# Patient Record
Sex: Male | Born: 1960 | Race: White | Hispanic: No | Marital: Married | Smoking: Former smoker
Health system: Southern US, Community
[De-identification: ages and names within clinical notes are randomized; demographics above are authoritative.]

## PROBLEM LIST (undated history)

## (undated) DIAGNOSIS — E785 Hyperlipidemia, unspecified: Secondary | ICD-10-CM

## (undated) DIAGNOSIS — I2510 Atherosclerotic heart disease of native coronary artery without angina pectoris: Secondary | ICD-10-CM

## (undated) DIAGNOSIS — Z720 Tobacco use: Secondary | ICD-10-CM

## (undated) HISTORY — DX: Hyperlipidemia, unspecified: E78.5

## (undated) HISTORY — DX: Tobacco use: Z72.0

## (undated) HISTORY — DX: Atherosclerotic heart disease of native coronary artery without angina pectoris: I25.10

---

## 2006-04-28 ENCOUNTER — Ambulatory Visit: Payer: Self-pay | Admitting: Gastroenterology

## 2006-05-08 ENCOUNTER — Ambulatory Visit: Payer: Self-pay | Admitting: Gastroenterology

## 2011-04-12 ENCOUNTER — Encounter: Payer: Self-pay | Admitting: Gastroenterology

## 2012-01-06 ENCOUNTER — Encounter: Payer: Self-pay | Admitting: Gastroenterology

## 2015-09-18 ENCOUNTER — Other Ambulatory Visit: Payer: Self-pay | Admitting: Gastroenterology

## 2018-11-04 ENCOUNTER — Other Ambulatory Visit: Payer: Self-pay

## 2018-11-04 DIAGNOSIS — Z20822 Contact with and (suspected) exposure to covid-19: Secondary | ICD-10-CM

## 2018-11-05 LAB — NOVEL CORONAVIRUS, NAA: SARS-CoV-2, NAA: NOT DETECTED

## 2019-03-21 ENCOUNTER — Inpatient Hospital Stay (HOSPITAL_COMMUNITY): Payer: Commercial Managed Care - PPO

## 2019-03-21 ENCOUNTER — Encounter (HOSPITAL_COMMUNITY)
Admission: EM | Disposition: A | Discharge status: Home / Self Care | Payer: Self-pay | Source: Home / Self Care | Attending: Interventional Cardiology

## 2019-03-21 ENCOUNTER — Emergency Department (HOSPITAL_COMMUNITY): Payer: Commercial Managed Care - PPO

## 2019-03-21 ENCOUNTER — Other Ambulatory Visit: Payer: Self-pay

## 2019-03-21 ENCOUNTER — Inpatient Hospital Stay (HOSPITAL_COMMUNITY)
Admission: EM | Admit: 2019-03-21 | Discharge: 2019-03-22 | DRG: 247 | Disposition: A | Discharge status: Home / Self Care | Payer: Commercial Managed Care - PPO | Attending: Interventional Cardiology | Admitting: Interventional Cardiology

## 2019-03-21 DIAGNOSIS — Z955 Presence of coronary angioplasty implant and graft: Secondary | ICD-10-CM

## 2019-03-21 DIAGNOSIS — I340 Nonrheumatic mitral (valve) insufficiency: Secondary | ICD-10-CM | POA: Diagnosis not present

## 2019-03-21 DIAGNOSIS — F17290 Nicotine dependence, other tobacco product, uncomplicated: Secondary | ICD-10-CM | POA: Diagnosis present

## 2019-03-21 DIAGNOSIS — E785 Hyperlipidemia, unspecified: Secondary | ICD-10-CM | POA: Diagnosis present

## 2019-03-21 DIAGNOSIS — Z8249 Family history of ischemic heart disease and other diseases of the circulatory system: Secondary | ICD-10-CM | POA: Diagnosis not present

## 2019-03-21 DIAGNOSIS — I213 ST elevation (STEMI) myocardial infarction of unspecified site: Secondary | ICD-10-CM

## 2019-03-21 DIAGNOSIS — I361 Nonrheumatic tricuspid (valve) insufficiency: Secondary | ICD-10-CM

## 2019-03-21 DIAGNOSIS — I2510 Atherosclerotic heart disease of native coronary artery without angina pectoris: Secondary | ICD-10-CM | POA: Diagnosis present

## 2019-03-21 DIAGNOSIS — I2111 ST elevation (STEMI) myocardial infarction involving right coronary artery: Secondary | ICD-10-CM

## 2019-03-21 DIAGNOSIS — Z20822 Contact with and (suspected) exposure to covid-19: Secondary | ICD-10-CM | POA: Diagnosis present

## 2019-03-21 DIAGNOSIS — I2119 ST elevation (STEMI) myocardial infarction involving other coronary artery of inferior wall: Principal | ICD-10-CM | POA: Diagnosis present

## 2019-03-21 DIAGNOSIS — Z720 Tobacco use: Secondary | ICD-10-CM

## 2019-03-21 HISTORY — PX: CORONARY/GRAFT ACUTE MI REVASCULARIZATION: CATH118305

## 2019-03-21 HISTORY — PX: LEFT HEART CATH AND CORONARY ANGIOGRAPHY: CATH118249

## 2019-03-21 LAB — LIPID PANEL
Cholesterol: 190 mg/dL (ref 0–200)
Cholesterol: 185 mg/dL (ref 0–200)
HDL: 27 mg/dL — ABNORMAL LOW (ref >40)
HDL: 33 mg/dL — ABNORMAL LOW (ref >40)
LDL Cholesterol: 108 mg/dL — ABNORMAL HIGH (ref 0–99)
LDL Cholesterol: 131 mg/dL — ABNORMAL HIGH (ref 0–99)
Total CHOL/HDL Ratio: 7 RATIO
Total CHOL/HDL Ratio: 5.6 RATIO
Triglycerides: 273 mg/dL — ABNORMAL HIGH (ref <150)
Triglycerides: 106 mg/dL (ref <150)
VLDL: 55 mg/dL — ABNORMAL HIGH (ref 0–40)
VLDL: 21 mg/dL (ref 0–40)

## 2019-03-21 LAB — I-STAT CHEM 8, ED
BUN: 18 mg/dL (ref 6–20)
Calcium, Ion: 1.15 mmol/L (ref 1.15–1.40)
Chloride: 106 mmol/L (ref 98–111)
Creatinine, Ser: 0.9 mg/dL (ref 0.61–1.24)
Glucose, Bld: 129 mg/dL — ABNORMAL HIGH (ref 70–99)
HCT: 44 % (ref 39.0–52.0)
Hemoglobin: 15 g/dL (ref 13.0–17.0)
Potassium: 3.4 mmol/L — ABNORMAL LOW (ref 3.5–5.1)
Sodium: 140 mmol/L (ref 135–145)
TCO2: 23 mmol/L (ref 22–32)

## 2019-03-21 LAB — CBC WITH DIFFERENTIAL/PLATELET
Abs Immature Granulocytes: 0.03 10*3/uL (ref 0.00–0.07)
Basophils Absolute: 0.1 10*3/uL (ref 0.0–0.1)
Basophils Relative: 1 %
Eosinophils Absolute: 0.2 10*3/uL (ref 0.0–0.5)
Eosinophils Relative: 3 %
HCT: 45.2 % (ref 39.0–52.0)
Hemoglobin: 15.4 g/dL (ref 13.0–17.0)
Immature Granulocytes: 0 %
Lymphocytes Relative: 44 %
Lymphs Abs: 3.7 10*3/uL (ref 0.7–4.0)
MCH: 32.2 pg (ref 26.0–34.0)
MCHC: 34.1 g/dL (ref 30.0–36.0)
MCV: 94.4 fL (ref 80.0–100.0)
Monocytes Absolute: 0.9 10*3/uL (ref 0.1–1.0)
Monocytes Relative: 10 %
Neutro Abs: 3.5 10*3/uL (ref 1.7–7.7)
Neutrophils Relative %: 42 %
Platelets: 245 10*3/uL (ref 150–400)
RBC: 4.79 MIL/uL (ref 4.22–5.81)
RDW: 12.8 % (ref 11.5–15.5)
WBC: 8.4 10*3/uL (ref 4.0–10.5)
nRBC: 0 % (ref 0.0–0.2)

## 2019-03-21 LAB — COMPREHENSIVE METABOLIC PANEL
ALT: 37 U/L (ref 0–44)
AST: 26 U/L (ref 15–41)
Albumin: 3.4 g/dL — ABNORMAL LOW (ref 3.5–5.0)
Alkaline Phosphatase: 81 U/L (ref 38–126)
Anion gap: 7 (ref 5–15)
BUN: 16 mg/dL (ref 6–20)
CO2: 25 mmol/L (ref 22–32)
Calcium: 8.6 mg/dL — ABNORMAL LOW (ref 8.9–10.3)
Chloride: 107 mmol/L (ref 98–111)
Creatinine, Ser: 0.96 mg/dL (ref 0.61–1.24)
GFR calc Af Amer: >60 mL/min (ref >60)
GFR calc non Af Amer: >60 mL/min (ref >60)
Glucose, Bld: 135 mg/dL — ABNORMAL HIGH (ref 70–99)
Potassium: 3.5 mmol/L (ref 3.5–5.1)
Sodium: 139 mmol/L (ref 135–145)
Total Bilirubin: 0.5 mg/dL (ref 0.3–1.2)
Total Protein: 5.8 g/dL — ABNORMAL LOW (ref 6.5–8.1)

## 2019-03-21 LAB — CBC
HCT: 47.5 % (ref 39.0–52.0)
Hemoglobin: 15.9 g/dL (ref 13.0–17.0)
MCH: 32.1 pg (ref 26.0–34.0)
MCHC: 33.5 g/dL (ref 30.0–36.0)
MCV: 96 fL (ref 80.0–100.0)
Platelets: 233 10*3/uL (ref 150–400)
RBC: 4.95 MIL/uL (ref 4.22–5.81)
RDW: 12.9 % (ref 11.5–15.5)
WBC: 9.6 10*3/uL (ref 4.0–10.5)
nRBC: 0 % (ref 0.0–0.2)

## 2019-03-21 LAB — TROPONIN I (HIGH SENSITIVITY)
Troponin I (High Sensitivity): 296 ng/L (ref <18)
Troponin I (High Sensitivity): 4938 ng/L (ref <18)

## 2019-03-21 LAB — ECHOCARDIOGRAM COMPLETE
Height: 66 in
Weight: 3280 oz

## 2019-03-21 LAB — BASIC METABOLIC PANEL
Anion gap: 8 (ref 5–15)
BUN: 16 mg/dL (ref 6–20)
CO2: 24 mmol/L (ref 22–32)
Calcium: 8.9 mg/dL (ref 8.9–10.3)
Chloride: 108 mmol/L (ref 98–111)
Creatinine, Ser: 0.93 mg/dL (ref 0.61–1.24)
GFR calc Af Amer: >60 mL/min (ref >60)
GFR calc non Af Amer: >60 mL/min (ref >60)
Glucose, Bld: 115 mg/dL — ABNORMAL HIGH (ref 70–99)
Potassium: 3.8 mmol/L (ref 3.5–5.1)
Sodium: 140 mmol/L (ref 135–145)

## 2019-03-21 LAB — PROTIME-INR
INR: 1.1 (ref 0.8–1.2)
Prothrombin Time: 13.6 seconds (ref 11.4–15.2)

## 2019-03-21 LAB — I-STAT CREATININE, ED: Creatinine, Ser: 1 mg/dL (ref 0.61–1.24)

## 2019-03-21 LAB — HEMOGLOBIN A1C
Hgb A1c MFr Bld: 5.5 % (ref 4.8–5.6)
Mean Plasma Glucose: 111.15 mg/dL

## 2019-03-21 LAB — RESPIRATORY PANEL BY RT PCR (FLU A&B, COVID)
Influenza A by PCR: NEGATIVE
Influenza B by PCR: NEGATIVE
SARS Coronavirus 2 by RT PCR: NEGATIVE

## 2019-03-21 LAB — APTT: aPTT: 24 seconds (ref 24–36)

## 2019-03-21 LAB — MRSA PCR SCREENING: MRSA by PCR: NEGATIVE

## 2019-03-21 SURGERY — CORONARY/GRAFT ACUTE MI REVASCULARIZATION
Anesthesia: LOCAL

## 2019-03-21 MED ORDER — LABETALOL HCL 5 MG/ML IV SOLN: 10.0000 mg | INTRAVENOUS | Status: AC | PRN

## 2019-03-21 MED ORDER — ASPIRIN 81 MG PO CHEW
81.0000 mg | CHEWABLE_TABLET | Freq: Every day | ORAL | Status: DC
  Administered 2019-03-21 – 2019-03-22 (×2): 81 mg via ORAL
  Filled 2019-03-21 (×2): qty 1

## 2019-03-21 MED ORDER — HEPARIN SODIUM (PORCINE) 1000 UNIT/ML IJ SOLN
INTRAMUSCULAR | Status: AC
  Filled 2019-03-21: qty 1

## 2019-03-21 MED ORDER — HEPARIN SODIUM (PORCINE) 1000 UNIT/ML IJ SOLN
INTRAMUSCULAR | Status: DC | PRN
  Administered 2019-03-21: 3000 [IU] via INTRAVENOUS
  Administered 2019-03-21: 4000 [IU] via INTRAVENOUS
  Administered 2019-03-21: 8000 [IU] via INTRAVENOUS

## 2019-03-21 MED ORDER — VERAPAMIL HCL 2.5 MG/ML IV SOLN
INTRAVENOUS | Status: AC
  Filled 2019-03-21: qty 2

## 2019-03-21 MED ORDER — NITROGLYCERIN 1 MG/10 ML FOR IR/CATH LAB
INTRA_ARTERIAL | Status: DC | PRN
  Administered 2019-03-21: 200 ug via INTRA_ARTERIAL

## 2019-03-21 MED ORDER — TIROFIBAN HCL IN NACL 5-0.9 MG/100ML-% IV SOLN
INTRAVENOUS | Status: AC | PRN
  Administered 2019-03-21: 0.15 ug/kg/min via INTRAVENOUS

## 2019-03-21 MED ORDER — SODIUM CHLORIDE 0.9% FLUSH
3.0000 mL | Freq: Two times a day (BID) | INTRAVENOUS | Status: DC
  Administered 2019-03-21: 22:00:00 3 mL via INTRAVENOUS

## 2019-03-21 MED ORDER — NITROGLYCERIN 1 MG/10 ML FOR IR/CATH LAB
INTRA_ARTERIAL | Status: AC
  Filled 2019-03-21: qty 10

## 2019-03-21 MED ORDER — ACETAMINOPHEN 325 MG PO TABS: 650.0000 mg | ORAL_TABLET | ORAL | Status: DC | PRN

## 2019-03-21 MED ORDER — IOHEXOL 350 MG/ML SOLN
INTRAVENOUS | Status: DC | PRN
  Administered 2019-03-21: 130 mL

## 2019-03-21 MED ORDER — METOPROLOL TARTRATE 12.5 MG HALF TABLET
12.5000 mg | ORAL_TABLET | Freq: Two times a day (BID) | ORAL | Status: DC
  Administered 2019-03-21 – 2019-03-22 (×3): 12.5 mg via ORAL
  Filled 2019-03-21 (×3): qty 1

## 2019-03-21 MED ORDER — TICAGRELOR 90 MG PO TABS
ORAL_TABLET | ORAL | Status: AC
  Filled 2019-03-21: qty 2

## 2019-03-21 MED ORDER — HEPARIN (PORCINE) IN NACL 1000-0.9 UT/500ML-% IV SOLN
INTRAVENOUS | Status: AC
  Filled 2019-03-21: qty 1000

## 2019-03-21 MED ORDER — ATORVASTATIN CALCIUM 80 MG PO TABS
80.0000 mg | ORAL_TABLET | Freq: Every day | ORAL | Status: DC
  Administered 2019-03-21: 80 mg via ORAL
  Filled 2019-03-21: qty 1

## 2019-03-21 MED ORDER — SODIUM CHLORIDE 0.9 % IV SOLN: INTRAVENOUS | Status: AC

## 2019-03-21 MED ORDER — SODIUM CHLORIDE 0.9 % IV SOLN: 250.0000 mL | INTRAVENOUS | Status: DC | PRN

## 2019-03-21 MED ORDER — HYDRALAZINE HCL 20 MG/ML IJ SOLN: 10.0000 mg | INTRAMUSCULAR | Status: AC | PRN

## 2019-03-21 MED ORDER — LIDOCAINE HCL (PF) 1 % IJ SOLN
INTRAMUSCULAR | Status: DC | PRN
  Administered 2019-03-21: 2 mL

## 2019-03-21 MED ORDER — CHLORHEXIDINE GLUCONATE CLOTH 2 % EX PADS
6.0000 | MEDICATED_PAD | Freq: Every day | CUTANEOUS | Status: DC
  Administered 2019-03-21: 6 via TOPICAL

## 2019-03-21 MED ORDER — TICAGRELOR 90 MG PO TABS
ORAL_TABLET | ORAL | Status: DC | PRN
  Administered 2019-03-21: 180 mg via ORAL

## 2019-03-21 MED ORDER — SODIUM CHLORIDE 0.9 % IV SOLN
INTRAVENOUS | Status: DC
  Administered 2019-03-21: 02:00:00 10 mL/h via INTRAVENOUS

## 2019-03-21 MED ORDER — HEPARIN SODIUM (PORCINE) 1000 UNIT/ML IJ SOLN
INTRAMUSCULAR | Status: AC
  Filled 2019-03-21: qty 2

## 2019-03-21 MED ORDER — HEPARIN SODIUM (PORCINE) 5000 UNIT/ML IJ SOLN
4000.0000 [IU] | Freq: Once | INTRAMUSCULAR | Status: AC
  Administered 2019-03-21: 02:00:00 4000 [IU] via INTRAVENOUS

## 2019-03-21 MED ORDER — TIROFIBAN (AGGRASTAT) BOLUS VIA INFUSION
INTRAVENOUS | Status: DC | PRN
  Administered 2019-03-21: 03:00:00 2325 ug via INTRAVENOUS

## 2019-03-21 MED ORDER — TICAGRELOR 90 MG PO TABS
90.0000 mg | ORAL_TABLET | Freq: Two times a day (BID) | ORAL | Status: DC
  Administered 2019-03-21 – 2019-03-22 (×3): 90 mg via ORAL
  Filled 2019-03-21 (×3): qty 1

## 2019-03-21 MED ORDER — VERAPAMIL HCL 2.5 MG/ML IV SOLN
INTRAVENOUS | Status: DC | PRN
  Administered 2019-03-21: 02:00:00 10 mL via INTRA_ARTERIAL

## 2019-03-21 MED ORDER — SODIUM CHLORIDE 0.9% FLUSH: 3.0000 mL | INTRAVENOUS | Status: DC | PRN

## 2019-03-21 MED ORDER — HEPARIN (PORCINE) IN NACL 1000-0.9 UT/500ML-% IV SOLN
INTRAVENOUS | Status: DC | PRN
  Administered 2019-03-21 (×2): 500 mL

## 2019-03-21 MED ORDER — LIDOCAINE HCL (PF) 1 % IJ SOLN
INTRAMUSCULAR | Status: AC
  Filled 2019-03-21: qty 30

## 2019-03-21 MED ORDER — ONDANSETRON HCL 4 MG/2ML IJ SOLN: 4.0000 mg | Freq: Four times a day (QID) | INTRAMUSCULAR | Status: DC | PRN

## 2019-03-21 SURGICAL SUPPLY — 20 items
BALLN SAPPHIRE 2.5X12 (BALLOONS) ×2
BALLOON SAPPHIRE 2.5X12 (BALLOONS) IMPLANT
CATH EXTRAC PRONTO 5.5F 138CM (CATHETERS) ×1 IMPLANT
CATH INFINITI 5 FR JL3.5 (CATHETERS) ×1 IMPLANT
CATH INFINITI JR4 5F (CATHETERS) ×1 IMPLANT
CATH LAUNCHER 6FR AL1 (CATHETERS) IMPLANT
CATH TELESCOPE 6F GEC (CATHETERS) ×1 IMPLANT
CATHETER LAUNCHER 6FR AL1 (CATHETERS) ×2
GLIDESHEATH SLEND SS 6F .021 (SHEATH) ×1 IMPLANT
GUIDEWIRE INQWIRE 1.5J.035X260 (WIRE) IMPLANT
INQWIRE 1.5J .035X260CM (WIRE) ×2
KIT ENCORE 26 ADVANTAGE (KITS) ×1 IMPLANT
KIT HEART LEFT (KITS) ×2 IMPLANT
KIT HEMO VALVE WATCHDOG (MISCELLANEOUS) ×1 IMPLANT
PACK CARDIAC CATHETERIZATION (CUSTOM PROCEDURE TRAY) ×2 IMPLANT
STENT RESOLUTE ONYX 4.0X26 (Permanent Stent) ×1 IMPLANT
TRANSDUCER W/STOPCOCK (MISCELLANEOUS) ×2 IMPLANT
TUBING CIL FLEX 10 FLL-RA (TUBING) ×2 IMPLANT
WIRE ASAHI PROWATER 180CM (WIRE) ×2 IMPLANT
WIRE HI TORQ BMW 190CM (WIRE) ×1 IMPLANT

## 2019-03-21 NOTE — ED Notes (Signed)
Patient to Cath lab 

## 2019-03-21 NOTE — Progress Notes (Signed)
  Echocardiogram 2D Echocardiogram has been performed.  Celene Skeen 03/21/2019, 3:52 PM

## 2019-03-21 NOTE — H&P (Addendum)
CARDIOLOGY ADMISSION NOTE  [name] MRN: [id number] DOB/AGE: 1960/04/25 59 y.o.  Admit date: 03/21/2019 Primary Physician No primary care provider on file. Primary Cardiologist   No primary care provider on file. Chief Complaint : STEMI  HPI:  Mr. Gregory Villa is a 59 year old male with tobacco use, but no other significant past medical history who presents with acute onset chest pain and ECG concerning for ST elevation in the inferoposterior leads.  Patient was in his usual state of health until early the morning of admission around 1230 am when he developed acute onset, severe substernal chest pain with radiation down the left arm associated with nausea. EMS was called with initial strip showing ST elevations in the inferior leads with ST depressions in V2, V3. He received 324mg  of aspirin and SLNTG which dropped his pressures, but relieved his chest discomfort. He subsequently received a bolus of IV crystalloids with improvement in his pressures.   Patient was brought to the ED where initial vital signs showed BP 122/92, HR 53. Initial ECG in the [facility] ED showed persistent STE and he remained chest pain free. He received 4000 units of heparin and was brought to the cath lab once the team had assembled.   Patient denies a previous history of coronary disease or a chest pain/discomfort. He smokes cigars daily, but denies any recreational drug use.    No past medical history on file.  **The histories are not reviewed yet. Please review them in the "History" navigator section and refresh this SmartLink.  Not on File No current facility-administered medications on file prior to encounter.   No current outpatient medications on file prior to encounter.   Social History   Socioeconomic History  . Marital status: Divorced    Spouse name: Not on file  . Number of children: Not on file  . Years of education: Not on file  . Highest education level: Not on file  Occupational  History  . Not on file  Tobacco Use  . Smoking status: Not on file  Substance and Sexual Activity  . Alcohol use: Not on file  . Drug use: Not on file  . Sexual activity: Not on file  Other Topics Concern  . Not on file  Social History Narrative  . Not on file   Social Determinants of Health   Financial Resource Strain:   . Difficulty of Paying Living Expenses: Not on file  Food Insecurity:   . Worried About [facility] in the Last Year: Not on file  . Ran Out of Food in the Last Year: Not on file  Transportation Needs:   . Lack of Transportation (Medical): Not on file  . Lack of Transportation (Non-Medical): Not on file  Physical Activity:   . Days of Exercise per Week: Not on file  . Minutes of Exercise per Session: Not on file  Stress:   . Feeling of Stress : Not on file  Social Connections:   . Frequency of Communication with Friends and Family: Not on file  . Frequency of Social Gatherings with Friends and Family: Not on file  . Attends Religious Services: Not on file  . Active Member of Clubs or Organizations: Not on file  . Attends Programme researcher, broadcasting/film/video Meetings: Not on file  . Marital Status: Not on file  Intimate Partner Violence:   . Fear of Current or Ex-Partner: Not on file  . Emotionally Abused: Not on file  . Physically Abused:  Not on file  . Sexually Abused: Not on file    No family history on file. Two brothers who have had open heart surgery.    Review of Systems: [y] = yes, [ ]  = no       General: Weight gain [] ; Weight loss [ ] ; Anorexia [ ] ; Fatigue [ ] ; Fever [ ] ; Chills [ ] ; Weakness [ ]     Cardiac: As above in HPI.   Pulmonary: Cough [ ] ; Wheezing[ ] ; Hemoptysis[ ] ; Sputum [ ] ; Snoring [ ]     GI: Vomiting[ ] ; Dysphagia[ ] ; Melena[ ] ; Hematochezia [ ] ; Heartburn[ ] ; Abdominal pain [ ] ; Constipation [ ] ; Diarrhea [ ] ; BRBPR [ ]     GU: Hematuria[ ] ; Dysuria [ ] ; Nocturia[ ]   Vascular: Pain in legs with walking [ ] ; Pain in feet with  lying flat [ ] ; Non-healing sores [ ] ; Stroke [ ] ; TIA [ ] ; Slurred speech [ ] ;    Neuro: Headaches[ ] ; Vertigo[ ] ; Seizures[ ] ; Paresthesias[ ] ;Blurred vision [ ] ; Diplopia [ ] ; Vision changes [ ]     Ortho/Skin: Arthritis [ ] ; Joint pain [ ] ; Muscle pain [ ] ; Joint swelling [ ] ; Back Pain [ ] ; Rash [ ]     Psych: Depression[ ] ; Anxiety[ ]     Heme: Bleeding problems [ ] ; Clotting disorders [ ] ; Anemia [ ]     Endocrine: Diabetes [ ] ; Thyroid dysfunction[ ]   Physical Exam: Blood pressure (!) 122/92, pulse (!) 53, temperature 97.7 F (36.5 C), temperature source Oral, resp. rate (!) 23, height 5\' 6"  (1.676 m), weight 93 kg, SpO2 100 %.   GENERAL: Patient is afebrile, Vital signs reviewed, Well appearing, Patient appears comfortable, Alert and lucid. EYES: Normal inspection. HEENT:  normocephalic, atraumatic , normal ENT inspection. NECK:  supple , normal inspection. CARD:  Regular rhythm, bradycardic, heart sounds normal. RESP:  no respiratory distress, breath sounds normal. ABD: soft, nontender, nondistended, BS present MUSC:  normal ROM, non-tender , no pedal edema . SKIN: color normal, no rash, warm, dry . NEURO: awake & alert, lucid, no motor/sensory deficit.  PSYCH: mood/affect normal.   Labs: No results found for: BUN No results found for: CREATININE No results found for: NA, K, CL, CO2 No results found for: TROPONINI No results found for: WBC, HGB, HCT, MCV, PLT No results found for: CHOL, HDL, LDLCALC, LDLDIRECT, TRIG, CHOLHDL No results found for: ALT, AST, GGT, ALKPHOS, BILITOT    Radiology:  None  EKG:  As above.   Cath:  Mid RCA lesion is 100% stenosed.  A drug-eluting stent was successfully placed using a STENT RESOLUTE ONYX 4.0X26.  Post intervention, there is a 0% residual stenosis.  RPAV lesion is 99% stenosed.  Balloon angioplasty was performed using a BALLOON SAPPHIRE 2.5X12.  Post intervention, there is a 30% residual stenosis.  1st Mrg lesion  is 90% stenosed.  Mid LAD lesion is 100% stenosed. This is a CTO with retrograde filling from the diagonal, antegrade into the mid to distal LAD.  Ost LAD to Prox LAD lesion is 50% stenosed.  Mid Cx lesion is 25% stenosed.  2nd Mrg lesion is 25% stenosed.  Dist Cx lesion is 80% stenosed. This is a small vessel.  The left ventricular systolic function is normal.  LV end diastolic pressure is mildly elevated.  The left ventricular ejection fraction is 55-65% by visual estimate.  There is no aortic valve stenosis.  ASSESSMENT AND PLAN:   Mr. Testerman is a 59 year old  male with tobacco use, but no other significant past medical history who presents with acute onset chest pain and ECG concerning for ST elevation in the inferoposterior leads. Patient is now s/p PCI of RCA lesions.    # STEMI # CAD s/p PCI (DES - mid RCA, POBA RPAV) with residual disease # Secondary Prevention/Risk Factor Modification - DAPT with Asa 81mg  and Ticagrelor 90mg  daily  - Atorvastatin 80mg  daily - Check lipid panel and A1c - Strongly encourage tobacco cessation - Consider bringing back in 4 weeks for staged intervention of CTO in mid LAD  - TTE in AM    Signed: 03/21/2019, 2:02 AM   I have examined the patient and reviewed assessment and plan and discussed with patient.  Agree with above as stated.  I personally reviewed the ECG and made the decision for the patient to come to the cath lab for emergency procedure.  Severe diffuse disease.  Culprit was RCA.  DES to mid RCA and POBA to posterolateral branch.  IV tirofiban to help reduce large thrombus burden.    Discussed with patient and wife that we would plan for CTO PCI of LAD in 4 weeks or so.  They are in agreement.    Post procedural right radial hematoma now resolved with compression and adjustment of TR band placement.    Spoke about RF modification including stopping smoking.   

## 2019-03-21 NOTE — ED Provider Notes (Signed)
[facility] EMERGENCY DEPARTMENT Provider Note   CSN: [id number] Arrival date & time: 03/21/19  0149     History Chief Complaint  Patient presents with  . Code STEMI    Gregory Villa is a 59 y.o. male.  The history is provided by the patient and medical records.    59 y.o. M arriving to ED as a CODE STEMI.  States he woke up with mild pain around 0030 this morning with some left sided chest pain.  States he felt nauseated, SOB, and had pain in left arm.  EMS was called and was given 325 ASA and 1 NTG.  BP dropped into the 80's after NTG, improved to low 100's with small IVFB.  On arrival, minimal pain, just some mild substernal discomfort.  No further nausea, SOB, or left arm pain.  No prior cardiac history, no recent exertional symptoms.  No recent cough, fever, or other URI symptoms.    No past medical history on file.  There are no problems to display for this patient.       No family history on file.  Social History   Tobacco Use  . Smoking status: Not on file  Substance Use Topics  . Alcohol use: Not on file  . Drug use: Not on file    Home Medications Prior to Admission medications   Not on File    Allergies    Patient has no allergy information on record.  Review of Systems   Review of Systems  Cardiovascular: Positive for chest pain.  All other systems reviewed and are negative.   Physical Exam Updated Vital Signs BP (!) 122/92 (BP Location: Right Arm)   Pulse (!) 53   Temp 97.7 F (36.5 C) (Oral)   Resp (!) 23   Ht 5\' 6"  (1.676 m)   Wt 93 kg   SpO2 100%   BMI 33.09 kg/m   Physical Exam Vitals and nursing note reviewed.  Constitutional:      Appearance: He is well-developed.  HENT:     Head: Normocephalic and atraumatic.  Eyes:     Conjunctiva/sclera: Conjunctivae normal.     Pupils: Pupils are equal, round, and reactive to light.  Cardiovascular:     Rate and Rhythm: Normal rate and regular rhythm.     Heart  sounds: Normal heart sounds.  Pulmonary:     Effort: Pulmonary effort is normal. No respiratory distress.     Breath sounds: Normal breath sounds. No stridor.  Abdominal:     General: Bowel sounds are normal.     Palpations: Abdomen is soft.  Musculoskeletal:        General: Normal range of motion.     Cervical back: Normal range of motion.  Skin:    General: Skin is warm and dry.  Neurological:     Mental Status: He is alert and oriented to person, place, and time.     ED Results / Procedures / Treatments   Labs (all labs ordered are listed, but only abnormal results are displayed) Labs Reviewed  I-STAT CHEM 8, ED - Abnormal; Notable for the following components:      Result Value   Potassium 3.4 (*)    Glucose, Bld 129 (*)    All other components within normal limits  RESPIRATORY PANEL BY RT PCR (FLU A&B, COVID)  CBC WITH DIFFERENTIAL/PLATELET  PROTIME-INR  APTT  COMPREHENSIVE METABOLIC PANEL  LIPID PANEL  I-STAT CREATININE, ED  TROPONIN I (HIGH SENSITIVITY)  EKG EKG Interpretation  Date/Time:  Sunday March 21 2019 01:55:16 EST Ventricular Rate:  53 PR Interval:    QRS Duration: 95 QT Interval:  426 QTC Calculation: 400 R Axis:   83 Text Interpretation: Sinus rhythm Inferoposterior infarct, acute (RCA) Probable RV involvement, suggest recording right precordial leads >>> Acute MI <<< Confirmed by Gilda Crease [phone]) on 03/21/2019 2:00:19 AM   Radiology No results found.  Procedures Procedures (including critical care time)  CRITICAL CARE Performed by: Garlon Hatchet   Total critical care time: 45 minutes  Critical care time was exclusive of separately billable procedures and treating other patients.  Critical care was necessary to treat or prevent imminent or life-threatening deterioration.  Critical care was time spent personally by me on the following activities: development of treatment plan with patient and/or surrogate as well as  nursing, discussions with consultants, evaluation of patient's response to treatment, examination of patient, obtaining history from patient or surrogate, ordering and performing treatments and interventions, ordering and review of laboratory studies, ordering and review of radiographic studies, pulse oximetry and re-evaluation of patient's condition.   Medications Ordered in ED Medications  0.9 %  sodium chloride infusion (10 mL/hr Intravenous New Bag/Given 03/21/19 0203)  heparin injection 4,000 Units (4,000 Units Intravenous Given 03/21/19 0203)    ED Course  I have reviewed the triage vital signs and the nursing notes.  Pertinent labs & imaging results that were available during my care of the patient were reviewed by me and considered in my medical decision making (see chart for details).    MDM Rules/Calculators/A&P  59 y.o. M presenting to the ED as  CODE STEMI.  Chest pain began around 0030 this morning with associated left arm pain, nausea, and SOB.  Pain improved with ASA and NTG, slight drop in BP which improved with IVFB.  No prior cardiac history, no exertional symptoms recently.  EKG here with inferior STEMI.  Patient evaluated by cardiology at bedside, will take emergently to cath lab.  IV heparin started.  Rapid COVID sent.  Final Clinical Impression(s) / ED Diagnoses Final diagnoses:  ST elevation myocardial infarction (STEMI), unspecified artery [facility])    Rx / DC Orders ED Discharge Orders    None       Garlon Hatchet, PA-C 03/21/19 0227    Gilda Crease, MD 03/21/19 [phone]

## 2019-03-21 NOTE — Plan of Care (Signed)
  Problem: Education: Goal: Knowledge of General Education information will improve Description: Including pain rating scale, medication(s)/side effects and non-pharmacologic comfort measures Outcome: Progressing   Problem: Health Behavior/Discharge Planning: Goal: Ability to manage health-related needs will improve Outcome: Progressing   Problem: Clinical Measurements: Goal: Ability to maintain clinical measurements within normal limits will improve Outcome: Progressing Goal: Will remain free from infection Outcome: Progressing Goal: Diagnostic test results will improve Outcome: Progressing Goal: Respiratory complications will improve Outcome: Progressing Goal: Cardiovascular complication will be avoided Outcome: Progressing   Problem: Activity: Goal: Risk for activity intolerance will decrease Outcome: Progressing   Problem: Nutrition: Goal: Adequate nutrition will be maintained Outcome: Progressing   Problem: Coping: Goal: Level of anxiety will decrease Outcome: Progressing   Problem: Elimination: Goal: Will not experience complications related to bowel motility Outcome: Progressing Goal: Will not experience complications related to urinary retention Outcome: Progressing   Problem: Pain Managment: Goal: General experience of comfort will improve Outcome: Progressing   Problem: Safety: Goal: Ability to remain free from injury will improve Outcome: Progressing   Problem: Skin Integrity: Goal: Risk for impaired skin integrity will decrease Outcome: Progressing   Problem: Education: Goal: Understanding of CV disease, CV risk reduction, and recovery process will improve Outcome: Progressing Goal: Individualized Educational Video(s) Outcome: Progressing   Problem: Activity: Goal: Ability to return to baseline activity level will improve Outcome: Progressing   Problem: Cardiovascular: Goal: Ability to achieve and maintain adequate cardiovascular perfusion  will improve Outcome: Progressing Goal: Vascular access site(s) Level 0-1 will be maintained Outcome: Progressing   Problem: Health Behavior/Discharge Planning: Goal: Ability to safely manage health-related needs after discharge will improve Outcome: Progressing   Problem: Education: Goal: Understanding of cardiac disease, CV risk reduction, and recovery process will improve Outcome: Progressing Goal: Understanding of medication regimen will improve Outcome: Progressing Goal: Individualized Educational Video(s) Outcome: Progressing   Problem: Activity: Goal: Ability to tolerate increased activity will improve Outcome: Progressing   Problem: Cardiac: Goal: Ability to achieve and maintain adequate cardiopulmonary perfusion will improve Outcome: Progressing Goal: Vascular access site(s) Level 0-1 will be maintained Outcome: Progressing   Problem: Health Behavior/Discharge Planning: Goal: Ability to safely manage health-related needs after discharge will improve Outcome: Progressing  Pt ambulated in halls with RN twice this shift. Pt tolerated well, no complications. Pt had 17 beat run VT, asymptomatic while resting in bed. No ectopy noted during ambulation or otherwise. Spouse at bedside most of day. VSS.

## 2019-03-21 NOTE — ED Triage Notes (Addendum)
BIB thru [facility], reportedly had gradual increasing chest pain around 12:30-12:35 am. Initial EKG onsite reportedly revealed acute ST elevation inferior infarct with posterior extension, patient was given aspiring and nitroglycerin en route to the hospital. Currently patient verbalized nitroglycerin helped and asymptomatic right now. VSS, cardiologist in right now discussing cardiac catherization.

## 2019-03-21 NOTE — ED Notes (Signed)
[phone] Gregory Villa pts wife update when available

## 2019-03-22 ENCOUNTER — Telehealth: Payer: Self-pay | Admitting: Physician Assistant

## 2019-03-22 DIAGNOSIS — E785 Hyperlipidemia, unspecified: Secondary | ICD-10-CM

## 2019-03-22 DIAGNOSIS — I213 ST elevation (STEMI) myocardial infarction of unspecified site: Secondary | ICD-10-CM

## 2019-03-22 DIAGNOSIS — Z720 Tobacco use: Secondary | ICD-10-CM

## 2019-03-22 LAB — CBC
HCT: 42.4 % (ref 39.0–52.0)
Hemoglobin: 14.7 g/dL (ref 13.0–17.0)
MCH: 33 pg (ref 26.0–34.0)
MCHC: 34.7 g/dL (ref 30.0–36.0)
MCV: 95.1 fL (ref 80.0–100.0)
Platelets: 230 10*3/uL (ref 150–400)
RBC: 4.46 MIL/uL (ref 4.22–5.81)
RDW: 13.1 % (ref 11.5–15.5)
WBC: 7.9 10*3/uL (ref 4.0–10.5)
nRBC: 0 % (ref 0.0–0.2)

## 2019-03-22 LAB — BASIC METABOLIC PANEL
Anion gap: 9 (ref 5–15)
BUN: 16 mg/dL (ref 6–20)
CO2: 21 mmol/L — ABNORMAL LOW (ref 22–32)
Calcium: 8.7 mg/dL — ABNORMAL LOW (ref 8.9–10.3)
Chloride: 106 mmol/L (ref 98–111)
Creatinine, Ser: 1.02 mg/dL (ref 0.61–1.24)
GFR calc Af Amer: >60 mL/min (ref >60)
GFR calc non Af Amer: >60 mL/min (ref >60)
Glucose, Bld: 101 mg/dL — ABNORMAL HIGH (ref 70–99)
Potassium: 4 mmol/L (ref 3.5–5.1)
Sodium: 136 mmol/L (ref 135–145)

## 2019-03-22 LAB — POCT ACTIVATED CLOTTING TIME
Activated Clotting Time: 246 seconds
Activated Clotting Time: 263 seconds

## 2019-03-22 LAB — TROPONIN I (HIGH SENSITIVITY): Troponin I (High Sensitivity): >27000 ng/L (ref <18)

## 2019-03-22 LAB — MAGNESIUM: Magnesium: 2.1 mg/dL (ref 1.7–2.4)

## 2019-03-22 MED ORDER — TICAGRELOR 90 MG PO TABS: 90.0000 mg | ORAL_TABLET | Freq: Two times a day (BID) | ORAL | 2 refills | Status: DC

## 2019-03-22 MED ORDER — NITROGLYCERIN 0.4 MG SL SUBL: 0.4000 mg | SUBLINGUAL_TABLET | SUBLINGUAL | 2 refills | Status: DC | PRN

## 2019-03-22 MED ORDER — ASPIRIN 81 MG PO CHEW: 81.0000 mg | CHEWABLE_TABLET | Freq: Every day | ORAL | 1 refills | Status: DC

## 2019-03-22 MED ORDER — METOPROLOL SUCCINATE ER 25 MG PO TB24: 12.5000 mg | ORAL_TABLET | Freq: Every day | ORAL | 0 refills | Status: DC

## 2019-03-22 MED ORDER — ATORVASTATIN CALCIUM 80 MG PO TABS: 80.0000 mg | ORAL_TABLET | Freq: Every day | ORAL | 1 refills | Status: DC

## 2019-03-22 MED ORDER — METOPROLOL SUCCINATE ER 25 MG PO TB24: 12.5000 mg | ORAL_TABLET | Freq: Every day | ORAL | Status: DC

## 2019-03-22 MED FILL — NITROGLYCERIN 0.4 MG TAB SL: 0.4 | 8 days supply | Qty: 25 | Fill #0

## 2019-03-22 MED FILL — METOPROLOL SUCCINATE ER 25: 25 | 30 days supply | Qty: 15 | Fill #0

## 2019-03-22 MED FILL — ATORVASTATIN CALCIUM 80 MG: 80 | 30 days supply | Qty: 30 | Fill #0

## 2019-03-22 MED FILL — ASPIRIN LOW DOSE 81 MG CHEW: 81 | 90 days supply | Qty: 90 | Fill #0

## 2019-03-22 MED FILL — BRILINTA 90 MG TABLET: 90 | 30 days supply | Qty: 60 | Fill #0

## 2019-03-22 NOTE — Telephone Encounter (Signed)
**Note De-identified Gregory Villa Obfuscation** The pt is being discharged from the hospital today. We will call him tomorrow 

## 2019-03-22 NOTE — Discharge Summary (Signed)
Discharge Summary    [name],  MRN: [id number], DOB/AGE: [birth year] 59 y.o.  Admit date: 03/21/2019 Discharge date: 03/22/2019  Primary Care Provider: No primary care provider on file. Primary Cardiologist: Gregory MussJayadeep Varanasi, MD  Discharge Diagnoses    Principal Problem:   STEMI (ST elevation myocardial infarction) [facility](HCC) Active Problems:   Hyperlipidemia   Tobacco use   Allergies No Known Allergies  Diagnostic Studies/Procedures    TTE: 03/21/19  IMPRESSIONS    1. Left ventricular ejection fraction, by visual estimation, is 55 to 60%. The left ventricle has normal function. There is moderately increased left ventricular hypertrophy.  2. The left ventricle has no regional wall motion abnormalities.  3. Global right ventricle has mildly reduced systolic function.The right ventricular size is mildly enlarged. No increase in right ventricular wall thickness.  4. Left atrial size was mildly dilated.  5. Right atrial size was normal.  6. The mitral valve is normal in structure. Mild mitral valve regurgitation. No evidence of mitral stenosis.  7. The tricuspid valve is normal in structure.  8. The aortic valve is normal in structure. Aortic valve regurgitation is not visualized. No evidence of aortic valve sclerosis or stenosis.  9. The pulmonic valve was normal in structure. Pulmonic valve regurgitation is not visualized. 10. The inferior vena cava is normal in size with greater than 50% respiratory variability, suggesting right atrial pressure of 3 mmHg. 11. RVEF is mildly reduced. 12. There is hypokinesis in the basal and mid inferior and inferolateral walls, overall LVEF 55-60%. There is significant LVH more pronounced in theapical segment, a cardiac MRI is recommended to evaluate for apical form of hypertrophic cardiomyopathy.  Cath: 03/21/19   Mid RCA lesion is 100% stenosed.  A drug-eluting stent was successfully placed using a STENT RESOLUTE ONYX  4.0X26.  Post intervention, there is a 0% residual stenosis.  RPAV lesion is 99% stenosed.  Balloon angioplasty was performed using a BALLOON SAPPHIRE 2.5X12.  Post intervention, there is a 30% residual stenosis.  1st Mrg lesion is 90% stenosed.  Mid LAD lesion is 100% stenosed. This is a CTO with retrograde filling from the diagonal, antegrade into the mid to distal LAD.  Ost LAD to Prox LAD lesion is 50% stenosed.  Mid Cx lesion is 25% stenosed.  2nd Mrg lesion is 25% stenosed.  Dist Cx lesion is 80% stenosed. This is a small vessel.  The left ventricular systolic function is normal.  LV end diastolic pressure is mildly elevated.  The left ventricular ejection fraction is 55-65% by visual estimate.  There is no aortic valve stenosis.   Continue DAPT and initiate aggressive secondary prevention.  Watch in ICU.  Consider CTO PCI of the LAD in 4 weeks once he has recovered from this MI.   Continue IV tirofiban for a few hours given heavy thrombus burden and residual thrombus in small, posterolateral artery branches.   Diagnostic Dominance: Right  Intervention    _____________   History of Present Illness     Gregory Villa is a 59 year old male with tobacco use, but no other significant past medical history who presented with acute onset chest pain and ECG concerning for ST elevation in the inferoposterior leads.  Patient was in his usual state of health until early the morning of admission around 1230 am when he developed acute onset, severe substernal chest pain with radiation down the left arm associated with nausea. EMS was called with initial strip showing ST  elevations in the inferior leads with ST depressions in V2, V3. He received 324mg  of aspirin and SLNTG which dropped his pressures, but relieved his chest discomfort. He subsequently received a bolus of IVFs.   Patient was brought to the ED where initial vital signs showed BP 122/92, HR 53. Initial ECG in the  [facility] ED showed persistent STE and he remained chest pain free. He received 4000 units of heparin and was brought to the cath lab.  Patient denied a previous history of coronary disease or a chest pain/discomfort. He smokes cigars daily, but denies any recreational drug use.    Hospital Course   Underwent cardiac cath noted above with 100% stenosis in the mRCA with PCI/DES x1. Also noted to have disease in the RPAV with balloon angioplasty, and CTO of the mLAD which fills via collaterals. 80% lesion in the Lcx which was noted to be a small nondominant vessel. He was started on DAPT with ASA/Brilinta post cath and continued on tirofiban for several hours post cath given heavy thrombus burden. hsTn peaked > 27000. He was started on metoprolol 12.5 mg daily with blood pressures tolerating. LDL noted at 131 and placed on high dose statin. Follow up echo showed normal EF with hypokinesis in the basal/mid inferolateral walls. Also noted to have significant LVH more pronounced in the apical segment. Recommendations for cardiac MRI to evaluate for apical form of hypertrophic cardiomyopathy. He worked well with cardiac rehab without recurrent chest pain. Per [facility], will plan for elective PCI of the LAD in about one month. He was counseled on the need for smoking cessation throughout admission.   Gregory Villa was seen by Dr. Noni Saupe and determined stable for discharge home. Follow up in the office has been arranged. Medications are listed below.   _____________  Discharge Vitals Blood pressure 106/75, pulse 64, temperature 98.5 F (36.9 C), temperature source Oral, resp. rate 19, height 5\' 6"  (1.676 m), weight 93 kg, SpO2 97 %.  Filed Weights   03/21/19 0158  Weight: 93 kg    Labs & Radiologic Studies    CBC Recent Labs    03/21/19 0156 03/21/19 0406 03/22/19 0203  WBC 8.4 9.6 7.9  NEUTROABS 3.5  --   --   HGB 15.4 15.9 14.7  HCT 45.2 47.5 42.4  MCV 94.4 96.0 95.1  PLT 245 233 230    Basic Metabolic Panel Recent Labs    05/19/19 0406 03/22/19 0203  NA 140 136  K 3.8 4.0  CL 108 106  CO2 24 21*  GLUCOSE 115* 101*  BUN 16 16  CREATININE 0.93 1.02  CALCIUM 8.9 8.7*  MG  --  2.1   Liver Function Tests Recent Labs    03/21/19 0156  AST 26  ALT 37  ALKPHOS 81  BILITOT 0.5  PROT 5.8*  ALBUMIN 3.4*   No results for input(s): LIPASE, AMYLASE in the last 72 hours. Cardiac Enzymes No results for input(s): CKTOTAL, CKMB, CKMBINDEX, TROPONINI in the last 72 hours. BNP Invalid input(s): POCBNP D-Dimer No results for input(s): DDIMER in the last 72 hours. Hemoglobin A1C Recent Labs    03/21/19 0406  HGBA1C 5.5   Fasting Lipid Panel Recent Labs    03/21/19 0406  CHOL 185  HDL 33*  LDLCALC 131*  TRIG 106  CHOLHDL 5.6   Thyroid Function Tests No results for input(s): TSH, T4TOTAL, T3FREE, THYROIDAB in the last 72 hours.  Invalid input(s): FREET3 _____________  CARDIAC CATHETERIZATION  Result Date:  03/21/2019  Mid RCA lesion is 100% stenosed.  A drug-eluting stent was successfully placed using a STENT RESOLUTE ONYX 4.0X26.  Post intervention, there is a 0% residual stenosis.  RPAV lesion is 99% stenosed.  Balloon angioplasty was performed using a BALLOON SAPPHIRE 2.5X12.  Post intervention, there is a 30% residual stenosis.  1st Mrg lesion is 90% stenosed.  Mid LAD lesion is 100% stenosed. This is a CTO with retrograde filling from the diagonal, antegrade into the mid to distal LAD.  Ost LAD to Prox LAD lesion is 50% stenosed.  Mid Cx lesion is 25% stenosed.  2nd Mrg lesion is 25% stenosed.  Dist Cx lesion is 80% stenosed. This is a small vessel.  The left ventricular systolic function is normal.  LV end diastolic pressure is mildly elevated.  The left ventricular ejection fraction is 55-65% by visual estimate.  There is no aortic valve stenosis.  Continue DAPT and initiate aggressive secondary prevention.  Watch in ICU.  Consider CTO PCI  of the LAD in 4 weeks once he has recovered from this MI. Continue IV tirofiban for a few hours given heavy thrombus burden and residual thrombus in small, posterolateral artery branches.   DG Chest Port 1 View  Result Date: 03/21/2019 CLINICAL DATA:  Chest pain. EXAM: PORTABLE CHEST 1 VIEW COMPARISON:  None. FINDINGS: Mild cardiomegaly. Normal mediastinal contours. Mild vascular congestion. Streaky opacities at the left lung base favor atelectasis. No pleural effusion or pneumothorax. No acute osseous abnormalities are seen. IMPRESSION: 1. Mild cardiomegaly with vascular congestion. 2. Streaky opacities at the left lung base favoring atelectasis. Electronically Signed   By: Narda RutherfordMelanie  Sanford M.D.   On: 03/21/2019 04:41   ECHOCARDIOGRAM COMPLETE  Result Date: 03/21/2019   ECHOCARDIOGRAM REPORT   Patient Name:   Gregory SaupeRICK Vanderschaaf Date of Exam: 03/21/2019 Medical Rec #:  [id number]       Height:       66.0 in Accession #:    [id number]      Weight:       205.0 lb Date of Birth:  1960/04/25       BSA:          2.02 m Patient Age:    58 years        BP:           106/78 mmHg Patient Gender: M               HR:           59 bpm. Exam Location:  Inpatient Procedure: 2D Echo Indications:    STEMI I21.3  History:        Patient has no prior history of Echocardiogram examinations.  Sonographer:    Celene SkeenVijay Shankar RDCS (AE) Referring Phys: [id number] Ralene OkFRANCIS UGOWE IMPRESSIONS  1. Left ventricular ejection fraction, by visual estimation, is 55 to 60%. The left ventricle has normal function. There is moderately increased left ventricular hypertrophy.  2. The left ventricle has no regional wall motion abnormalities.  3. Global right ventricle has mildly reduced systolic function.The right ventricular size is mildly enlarged. No increase in right ventricular wall thickness.  4. Left atrial size was mildly dilated.  5. Right atrial size was normal.  6. The mitral valve is normal in structure. Mild mitral valve regurgitation. No  evidence of mitral stenosis.  7. The tricuspid valve is normal in structure.  8. The aortic valve is normal in structure. Aortic valve regurgitation is not visualized. No evidence of aortic  valve sclerosis or stenosis.  9. The pulmonic valve was normal in structure. Pulmonic valve regurgitation is not visualized. 10. The inferior vena cava is normal in size with greater than 50% respiratory variability, suggesting right atrial pressure of 3 mmHg. 11. RVEF is mildly reduced. 12. There is hypokinesis in the basal and mid inferior and inferolateral walls, overall LVEF 55-60%. There is significant LVH more pronounced in theapical segment, a cardiac MRI is recommended to evaluate for apical form of hypertrophic cardiomyopathy. FINDINGS  Left Ventricle: Left ventricular ejection fraction, by visual estimation, is 55 to 60%. The left ventricle has normal function. The left ventricle has no regional wall motion abnormalities. There is moderately increased left ventricular hypertrophy. Concentric left ventricular hypertrophy. Normal left atrial pressure. Right Ventricle: The right ventricular size is mildly enlarged. No increase in right ventricular wall thickness. Global RV systolic function is has mildly reduced systolic function. Left Atrium: Left atrial size was mildly dilated. Right Atrium: Right atrial size was normal in size Pericardium: There is no evidence of pericardial effusion. Mitral Valve: The mitral valve is normal in structure. Mild mitral valve regurgitation. No evidence of mitral valve stenosis by observation. Tricuspid Valve: The tricuspid valve is normal in structure. Tricuspid valve regurgitation is mild. Aortic Valve: The aortic valve is normal in structure. Aortic valve regurgitation is not visualized. The aortic valve is structurally normal, with no evidence of sclerosis or stenosis. Pulmonic Valve: The pulmonic valve was normal in structure. Pulmonic valve regurgitation is not visualized. Pulmonic  regurgitation is not visualized. Aorta: The aortic root, ascending aorta and aortic arch are all structurally normal, with no evidence of dilitation or obstruction. Venous: The inferior vena cava is normal in size with greater than 50% respiratory variability, suggesting right atrial pressure of 3 mmHg. IAS/Shunts: No atrial level shunt detected by color flow Doppler. There is no evidence of a patent foramen ovale. No ventricular septal defect is seen or detected. There is no evidence of an atrial septal defect.  LEFT VENTRICLE PLAX 2D LVIDd:         4.80 cm LVIDs:         3.20 cm LV PW:         1.20 cm LV IVS:        1.20 cm LVOT diam:     2.10 cm LV SV:         67 ml LV SV Index:   31.47 LVOT Area:     3.46 cm  RIGHT VENTRICLE RV S prime:     12.60 cm/s TAPSE (M-mode): 2.5 cm LEFT ATRIUM             Index       RIGHT ATRIUM           Index LA diam:        4.20 cm 2.08 cm/m  RA Area:     15.80 cm LA Vol (A2C):   39.7 ml 19.64 ml/m RA Volume:   36.10 ml  17.86 ml/m LA Vol (A4C):   31.6 ml 15.64 ml/m LA Biplane Vol: 34.9 ml 17.27 ml/m  AORTIC VALVE LVOT Vmax:   92.00 cm/s LVOT Vmean:  68.700 cm/s LVOT VTI:    0.192 m  AORTA Ao Root diam: 3.50 cm MITRAL VALVE MV Area (PHT): 2.62 cm             SHUNTS MV PHT:        83.81 msec  Systemic VTI:  0.19 m MV Decel Time: 289 msec             Systemic Diam: 2.10 cm MV E velocity: 63.80 cm/s 103 cm/s MV A velocity: 60.80 cm/s 70.3 cm/s MV E/A ratio:  1.05       1.5  Tobias Alexander MD Electronically signed by Tobias Alexander MD Signature Date/Time: 03/21/2019/4:47:04 PM    Final    Disposition   Pt is being discharged home today in good condition.  Follow-up Plans & Appointments    Follow-up Information    [place], [place], [place] Follow up on 03/30/2019.   Specialty: Cardiology Why: at 9am for your follow up appt.  Contact information: [address] [place] [address] [phone]          Discharge Instructions    Amb Referral  to Cardiac Rehabilitation   Complete by: As directed    Diagnosis:  STEMI Coronary Stents PTCA     After initial evaluation and assessments completed: Virtual Based Care may be provided alone or in conjunction with Phase 2 Cardiac Rehab based on patient barriers.: Yes   Diet - low sodium heart healthy   Complete by: As directed    Discharge instructions   Complete by: As directed    Radial Site Care Refer to this sheet in the next few weeks. These instructions provide you with information on caring for yourself after your procedure. Your caregiver may also give you more specific instructions. Your treatment has been planned according to current medical practices, but problems sometimes occur. Call your caregiver if you have any problems or questions after your procedure. HOME CARE INSTRUCTIONS You may shower the day after the procedure.Remove the bandage (dressing) and gently wash the site with plain soap and water.Gently pat the site dry.  Do not apply powder or lotion to the site.  Do not submerge the affected site in water for 3 to 5 days.  Inspect the site at least twice daily.  Do not flex or bend the affected arm for 24 hours.  No lifting over 5 pounds (2.3 kg) for 5 days after your procedure.  Do not drive home if you are discharged the same day of the procedure. Have someone else drive you.  You may drive 24 hours after the procedure unless otherwise instructed by your caregiver.  What to expect: Any bruising will usually fade within 1 to 2 weeks.  Blood that collects in the tissue (hematoma) may be painful to the touch. It should usually decrease in size and tenderness within 1 to 2 weeks.  SEEK IMMEDIATE MEDICAL CARE IF: You have unusual pain at the radial site.  You have redness, warmth, swelling, or pain at the radial site.  You have drainage (other than a small amount of blood on the dressing).  You have chills.  You have a fever or persistent symptoms for more than 72  hours.  You have a fever and your symptoms suddenly get worse.  Your arm becomes pale, cool, tingly, or numb.  You have heavy bleeding from the site. Hold pressure on the site.   PLEASE DO NOT MISS ANY DOSES OF YOUR BRILINTA!!!!! Also keep a log of you blood pressures and bring back to your follow up appt. Please call the office with any questions.   Patients taking blood thinners should generally stay away from medicines like ibuprofen, Advil, Motrin, naproxen, and Aleve due to risk of stomach bleeding. You may take Tylenol  as directed or talk to your primary doctor about alternatives.  Some studies suggest Prilosec/Omeprazole interacts with Plavix. We changed your Prilosec/Omeprazole to the equivalent  dose of Protonix for less chance of interaction.   No driving for 2 weeks. No lifting over 10 lbs for 4 weeks. No sexual activity for 4 weeks. You may not return to work until cleared by your cardiologist. Keep procedure site clean & dry. If you notice increased pain, swelling, bleeding or pus, call/return!  You may shower, but no soaking baths/hot tubs/pools for 1 week.   Increase activity slowly   Complete by: As directed        Discharge Medications     Medication List    TAKE these medications   aspirin 81 MG chewable tablet Chew 1 tablet (81 mg total) by mouth daily. Start taking on: March 23, 2019   atorvastatin 80 MG tablet Commonly known as: LIPITOR Take 1 tablet (80 mg total) by mouth daily at 6 PM.   metoprolol succinate 25 MG 24 hr tablet Commonly known as: TOPROL-XL Take 0.5 tablets (12.5 mg total) by mouth daily. Start taking on: March 23, 2019   nitroGLYCERIN 0.4 MG SL tablet Commonly known as: Nitrostat Place 1 tablet (0.4 mg total) under the tongue every 5 (five) minutes as needed.   ticagrelor 90 MG Tabs tablet Commonly known as: BRILINTA Take 1 tablet (90 mg total) by mouth 2 (two) times daily.        Yes                               AHA/ACC  Clinical Performance & Quality Measures: 1. Aspirin prescribed? - Yes 2. ADP Receptor Inhibitor (Plavix/Clopidogrel, Brilinta/Ticagrelor or Effient/Prasugrel) prescribed (includes medically managed patients)? - Yes 3. Beta Blocker prescribed? - Yes 4. High Intensity Statin (Lipitor 40-80mg  or Crestor 20-40mg ) prescribed? - Yes 5. EF assessed during THIS hospitalization? - Yes 6. For EF <40%, was ACEI/ARB prescribed? - Not Applicable (EF >/= 40%) 7. For EF <40%, Aldosterone Antagonist (Spironolactone or Eplerenone) prescribed? - Not Applicable (EF >/= 40%) 8. Cardiac Rehab Phase II ordered (Included Medically managed Patients)? - Yes      Outstanding Labs/Studies   FLP/LFTs in 8 weeks.   Duration of Discharge Encounter   Greater than 30 minutes including physician time.  Signed, Laverda Page NP-C 03/22/2019, 11:44 AM

## 2019-03-22 NOTE — Telephone Encounter (Signed)
TOC Patient- Please call Patient- Pt have an appt on 03-30-19 with [facility]

## 2019-03-22 NOTE — Discharge Instructions (Signed)
Heart Attack A heart attack occurs when blood and oxygen supply to the heart is cut off. A heart attack causes damage to the heart that cannot be fixed. A heart attack is also called a myocardial infarction, or MI. If you think you are having a heart attack, do not wait to see if the symptoms will go away. Get medical help right away. What are the causes? This condition may be caused by:  A fatty substance (plaque) in the blood vessels (arteries). This can block the flow of blood to the heart.  A blood clot in the blood vessels that go to the heart. The blood clot blocks blood flow.  Low blood pressure.  An abnormal heartbeat.  Some diseases, such as problems in red blood cells (anemia)orproblems in breathing (respiratory failure).  Tightening (spasm) of a blood vessel that cuts off blood to the heart.  A tear in a blood vessel of the heart.  High blood pressure. What increases the risk? The following factors may make you more likely to develop this condition:  Aging. The older you are, the higher your risk.  Having a personal or family history of chest pain, heart attack, stroke, or narrowing of the arteries in the legs, arms, head, or stomach (peripheral artery disease).  Being male.  Smoking.  Not getting regular exercise.  Being overweight or obese.  Having high blood pressure.  Having high cholesterol.  Having diabetes.  Drinking too much alcohol.  Using illegal drugs, such as cocaine or methamphetamine. What are the signs or symptoms? Symptoms of this condition include:  Chest pain. It may feel like: ? Crushing or squeezing. ? Tightness, pressure, fullness, or heaviness.  Pain in the arm, neck, jaw, back, or upper body.  Shortness of breath.  Heartburn.  Upset stomach (indigestion).  Feeling like you may vomit (nauseous).  Cold sweats.  Feeling tired.  Sudden light-headedness. How is this treated? A heart attack must be treated as soon as  possible. Treatment may include:  Medicines to: ? Break up or dissolve blood clots. ? Thin blood and help prevent blood clots. ? Treat blood pressure. ? Improve blood flow to the heart. ? Reduce pain. ? Reduce cholesterol.  Procedures to widen a blocked artery and keep it open.  Open heart surgery.  Receiving oxygen.  Making your heart strong again (cardiac rehabilitation) through exercise, education, and counseling. Follow these instructions at home: Medicines  Take over-the-counter and prescription medicines only as told by your doctor. You may need to take medicine: ? To keep your blood from clotting too easily. ? To control blood pressure. ? To lower cholesterol. ? To control heart rhythms.  Do not take these medicines unless your doctor says it is okay: ? NSAIDs, such as ibuprofen. ? Supplements that have vitamin A, vitamin E, or both. ? Hormone replacement therapy that has estrogen with or without progestin. Lifestyle      Do not use any products that have nicotine or tobacco, such as cigarettes, e-cigarettes, and chewing tobacco. If you need help quitting, ask your doctor.  Avoid secondhand smoke.  Exercise regularly. Ask your doctor about a cardiac rehab program.  Eat heart-healthy foods. Your doctor will tell you what foods to eat.  Stay at a healthy weight.  Lower your stress level.  Do not use illegal drugs. Alcohol use  Do not drink alcohol if: ? Your doctor tells you not to drink. ? You are pregnant, may be pregnant, or are planning to become pregnant.    If you drink alcohol: ? Limit how much you use to:  0-1 drink a day for women.  0-2 drinks a day for men. ? Know how much alcohol is in your drink. In the [place] one drink equals one 12 oz bottle of beer (355 mL), one 5 oz glass of wine (148 mL), or one 1 oz glass of hard liquor (44 mL). General instructions  Work with your doctor to treat other problems you may have, such as diabetes or high  blood pressure.  Get screened for depression. Get treatment if needed.  Keep your vaccines up to date. Get the flu shot (influenza vaccine) every year.  Keep all follow-up visits as told by your doctor. This is important. Contact a doctor if:  You feel very sad.  You have trouble doing your daily activities. Get help right away if:  You have sudden, unexplained discomfort in your chest, arms, back, neck, jaw, or upper body.  You have shortness of breath.  You have sudden sweating or clammy skin.  You feel like you may vomit.  You vomit.  You feel tired or weak.  You get light-headed or dizzy.  You feel your heart beating fast.  You feel your heart skipping beats.  You have blood pressure that is higher than 180/120. These symptoms may be an emergency. Do not wait to see if the symptoms will go away. Get medical help right away. Call your local emergency services (911 in the [place] Do not drive yourself to the hospital. Summary  A heart attack occurs when blood and oxygen supply to the heart is cut off.  Do not take NSAIDs unless your doctor says it is okay.  Do not smoke. Avoid secondhand smoke.  Exercise regularly. Ask your doctor about a cardiac rehab program. This information is not intended to replace advice given to you by your health care provider. Make sure you discuss any questions you have with your health care provider. Document Revised: 06/15/2018 Document Reviewed: 06/15/2018 Elsevier Patient Education  2020 [employer]  

## 2019-03-22 NOTE — Progress Notes (Signed)
CARDIAC REHAB PHASE I   PRE:  Rate/Rhythm: 57 SB  BP:  Supine:   Sitting: 109/82  Standing:    SaO2: 100%RA  MODE:  Ambulation: 740 ft   POST:  Rate/Rhythm: 75 SR  BP:  Supine:   Sitting: 120/80  Standing:    SaO2: 100%RA 0910-1010 Pt walked 740 ft on RA with steady gait and no CP. Tolerated well. MI education completed with pt who voiced understanding. Stressed importance of brilinta with stent. Reviewed NTG use, MI restrictions, heart healthy food choices,walking for ex, smoking cessation, and CRP 2. Pt plans to quit smoking cigars. Smoking cessation handout given. Discussed CRP 2 . Referred to [place] as pt works at [employer]. Pt knows that he may be delayed in acceptance in program until after CTO in a month. Pt stated wife will assist with Virtual APP. Pt is interested in participating in Virtual Cardiac and Pulmonary Rehab. Pt advised that Virtual Cardiac and Pulmonary Rehab is provided at no cost to the patient.  Checklist:  1. Pt has smart device  ie smartphone and/or ipad for downloading an app  Yes 2. Reliable internet/wifi service    Yes 3. Understands how to use their smartphone and navigate within an app.  Yes   Wife to help per pt.   Pt verbalized understanding and is in agreement.    Luetta Nutting, RN BSN  03/22/2019 10:06 AM

## 2019-03-22 NOTE — Plan of Care (Signed)
Pt has met all goals for discharge 

## 2019-03-22 NOTE — Progress Notes (Signed)
Progress Note  Patient Name: Gregory Villa Date of Encounter: 03/22/2019  Primary Cardiologist: No primary care provider on file.   Subjective   Feels well.  No complaints this morning.  Denies chest pain or shortness of breath.  Ambulated yesterday in the hallway without symptoms.  Inpatient Medications    Scheduled Meds: . aspirin  81 mg Oral Daily  . atorvastatin  80 mg Oral q1800  . Chlorhexidine Gluconate Cloth  6 each Topical Daily  . metoprolol tartrate  12.5 mg Oral BID  . sodium chloride flush  3 mL Intravenous Q12H  . ticagrelor  90 mg Oral BID   Continuous Infusions: . sodium chloride Stopped (03/21/19 1000)  . sodium chloride     PRN Meds: sodium chloride, acetaminophen, ondansetron (ZOFRAN) IV, sodium chloride flush   Vital Signs    Vitals:   03/22/19 0300 03/22/19 0400 03/22/19 0756 03/22/19 0844  BP:  106/75    Pulse:   64   Resp:      Temp: 98.2 F (36.8 C)   98.5 F (36.9 C)  TempSrc: Oral   Oral  SpO2:      Weight:      Height:        Intake/Output Summary (Last 24 hours) at 03/22/2019 0853 Last data filed at 03/21/2019 2000 Gross per 24 hour  Intake 1219.5 ml  Output --  Net 1219.5 ml   Last 3 Weights 03/21/2019  Weight (lbs) 205 lb  Weight (kg) 92.987 kg      Telemetry    Sinus bradycardia/sinus rhythm without arrhythmia- Personally Reviewed  ECG    03/21/2019: Sinus rhythm, ST/T wave abnormality consider lateral ischemia - Personally Reviewed  Physical Exam  Alert, oriented, NAD GEN: No acute distress.   Neck: No JVD Cardiac: RRR, no murmurs, rubs, or gallops.  Respiratory: Clear to auscultation bilaterally. GI: Soft, nontender, non-distended  MS: No edema; No deformity.  Right radial site and right forearm clear. Neuro:  Nonfocal  Psych: Normal affect   Labs    High Sensitivity Troponin:   Recent Labs  Lab 03/21/19 0156 03/21/19 0406 03/21/19 1157  TROPONINIHS 296* 4,938* >27,000*      Chemistry Recent Labs  Lab  03/21/19 0156 03/21/19 0201 03/21/19 0406 03/22/19 0203  NA 139 140 140 136  K 3.5 3.4* 3.8 4.0  CL 107 106 108 106  CO2 25  --  24 21*  GLUCOSE 135* 129* 115* 101*  BUN 16 18 16 16   CREATININE 0.96 0.90  1.00 0.93 1.02  CALCIUM 8.6*  --  8.9 8.7*  PROT 5.8*  --   --   --   ALBUMIN 3.4*  --   --   --   AST 26  --   --   --   ALT 37  --   --   --   ALKPHOS 81  --   --   --   BILITOT 0.5  --   --   --   GFRNONAA >60  --  >60 >60  GFRAA >60  --  >60 >60  ANIONGAP 7  --  8 9     Hematology Recent Labs  Lab 03/21/19 0156 03/21/19 0201 03/21/19 0406 03/22/19 0203  WBC 8.4  --  9.6 7.9  RBC 4.79  --  4.95 4.46  HGB 15.4 15.0 15.9 14.7  HCT 45.2 44.0 47.5 42.4  MCV 94.4  --  96.0 95.1  MCH 32.2  --  32.1  33.0  MCHC 34.1  --  33.5 34.7  RDW 12.8  --  12.9 13.1  PLT 245  --  233 230    BNPNo results for input(s): BNP, PROBNP in the last 168 hours.   DDimer No results for input(s): DDIMER in the last 168 hours.   Radiology    CARDIAC CATHETERIZATION  Result Date: 03/21/2019  Mid RCA lesion is 100% stenosed.  A drug-eluting stent was successfully placed using a STENT RESOLUTE ONYX 4.0X26.  Post intervention, there is a 0% residual stenosis.  RPAV lesion is 99% stenosed.  Balloon angioplasty was performed using a BALLOON SAPPHIRE 2.5X12.  Post intervention, there is a 30% residual stenosis.  1st Mrg lesion is 90% stenosed.  Mid LAD lesion is 100% stenosed. This is a CTO with retrograde filling from the diagonal, antegrade into the mid to distal LAD.  Ost LAD to Prox LAD lesion is 50% stenosed.  Mid Cx lesion is 25% stenosed.  2nd Mrg lesion is 25% stenosed.  Dist Cx lesion is 80% stenosed. This is a small vessel.  The left ventricular systolic function is normal.  LV end diastolic pressure is mildly elevated.  The left ventricular ejection fraction is 55-65% by visual estimate.  There is no aortic valve stenosis.  Continue DAPT and initiate aggressive secondary  prevention.  Watch in ICU.  Consider CTO PCI of the LAD in 4 weeks once he has recovered from this MI. Continue IV tirofiban for a few hours given heavy thrombus burden and residual thrombus in small, posterolateral artery branches.   DG Chest Port 1 View  Result Date: 03/21/2019 CLINICAL DATA:  Chest pain. EXAM: PORTABLE CHEST 1 VIEW COMPARISON:  None. FINDINGS: Mild cardiomegaly. Normal mediastinal contours. Mild vascular congestion. Streaky opacities at the left lung base favor atelectasis. No pleural effusion or pneumothorax. No acute osseous abnormalities are seen. IMPRESSION: 1. Mild cardiomegaly with vascular congestion. 2. Streaky opacities at the left lung base favoring atelectasis. Electronically Signed   By: Narda Rutherford M.D.   On: 03/21/2019 04:41   ECHOCARDIOGRAM COMPLETE  Result Date: 03/21/2019   ECHOCARDIOGRAM REPORT   Patient Name:   [name] Date of Exam: 03/21/2019 Medical Rec #:  [id number]       Height:       66.0 in Accession #:    [id number]      Weight:       205.0 lb Date of Birth:  May 28, 1960       BSA:          2.02 m Patient Age:    58 years        BP:           106/78 mmHg Patient Gender: M               HR:           59 bpm. Exam Location:  Inpatient Procedure: 2D Echo Indications:    STEMI I21.3  History:        Patient has no prior history of Echocardiogram examinations.  Sonographer:    Celene Skeen RDCS (AE) Referring Phys: [id number] Ralene Ok IMPRESSIONS  1. Left ventricular ejection fraction, by visual estimation, is 55 to 60%. The left ventricle has normal function. There is moderately increased left ventricular hypertrophy.  2. The left ventricle has no regional wall motion abnormalities.  3. Global right ventricle has mildly reduced systolic function.The right ventricular size is mildly enlarged. No increase in right ventricular wall  thickness.  4. Left atrial size was mildly dilated.  5. Right atrial size was normal.  6. The mitral valve is normal in  structure. Mild mitral valve regurgitation. No evidence of mitral stenosis.  7. The tricuspid valve is normal in structure.  8. The aortic valve is normal in structure. Aortic valve regurgitation is not visualized. No evidence of aortic valve sclerosis or stenosis.  9. The pulmonic valve was normal in structure. Pulmonic valve regurgitation is not visualized. 10. The inferior vena cava is normal in size with greater than 50% respiratory variability, suggesting right atrial pressure of 3 mmHg. 11. RVEF is mildly reduced. 12. There is hypokinesis in the basal and mid inferior and inferolateral walls, overall LVEF 55-60%. There is significant LVH more pronounced in theapical segment, a cardiac MRI is recommended to evaluate for apical form of hypertrophic cardiomyopathy. FINDINGS  Left Ventricle: Left ventricular ejection fraction, by visual estimation, is 55 to 60%. The left ventricle has normal function. The left ventricle has no regional wall motion abnormalities. There is moderately increased left ventricular hypertrophy. Concentric left ventricular hypertrophy. Normal left atrial pressure. Right Ventricle: The right ventricular size is mildly enlarged. No increase in right ventricular wall thickness. Global RV systolic function is has mildly reduced systolic function. Left Atrium: Left atrial size was mildly dilated. Right Atrium: Right atrial size was normal in size Pericardium: There is no evidence of pericardial effusion. Mitral Valve: The mitral valve is normal in structure. Mild mitral valve regurgitation. No evidence of mitral valve stenosis by observation. Tricuspid Valve: The tricuspid valve is normal in structure. Tricuspid valve regurgitation is mild. Aortic Valve: The aortic valve is normal in structure. Aortic valve regurgitation is not visualized. The aortic valve is structurally normal, with no evidence of sclerosis or stenosis. Pulmonic Valve: The pulmonic valve was normal in structure. Pulmonic  valve regurgitation is not visualized. Pulmonic regurgitation is not visualized. Aorta: The aortic root, ascending aorta and aortic arch are all structurally normal, with no evidence of dilitation or obstruction. Venous: The inferior vena cava is normal in size with greater than 50% respiratory variability, suggesting right atrial pressure of 3 mmHg. IAS/Shunts: No atrial level shunt detected by color flow Doppler. There is no evidence of a patent foramen ovale. No ventricular septal defect is seen or detected. There is no evidence of an atrial septal defect.  LEFT VENTRICLE PLAX 2D LVIDd:         4.80 cm LVIDs:         3.20 cm LV PW:         1.20 cm LV IVS:        1.20 cm LVOT diam:     2.10 cm LV SV:         67 ml LV SV Index:   31.47 LVOT Area:     3.46 cm  RIGHT VENTRICLE RV S prime:     12.60 cm/s TAPSE (M-mode): 2.5 cm LEFT ATRIUM             Index       RIGHT ATRIUM           Index LA diam:        4.20 cm 2.08 cm/m  RA Area:     15.80 cm LA Vol (A2C):   39.7 ml 19.64 ml/m RA Volume:   36.10 ml  17.86 ml/m LA Vol (A4C):   31.6 ml 15.64 ml/m LA Biplane Vol: 34.9 ml 17.27 ml/m  AORTIC VALVE LVOT Vmax:  92.00 cm/s LVOT Vmean:  68.700 cm/s LVOT VTI:    0.192 m  AORTA Ao Root diam: 3.50 cm MITRAL VALVE MV Area (PHT): 2.62 cm             SHUNTS MV PHT:        83.81 msec           Systemic VTI:  0.19 m MV Decel Time: 289 msec             Systemic Diam: 2.10 cm MV E velocity: 63.80 cm/s 103 cm/s MV A velocity: 60.80 cm/s 70.3 cm/s MV E/A ratio:  1.05       1.5  Tobias Alexander MD Electronically signed by Tobias Alexander MD Signature Date/Time: 03/21/2019/4:47:04 PM    Final     Cardiac Studies   Echo: FINDINGS  Left Ventricle: Left ventricular ejection fraction, by visual estimation, is 55 to 60%. The left ventricle has normal function. The left ventricle has no regional wall motion abnormalities. There is moderately increased left ventricular hypertrophy.  Concentric left ventricular hypertrophy.  Normal left atrial pressure.  Right Ventricle: The right ventricular size is mildly enlarged. No increase in right ventricular wall thickness. Global RV systolic function is has mildly reduced systolic function.  Left Atrium: Left atrial size was mildly dilated.  Right Atrium: Right atrial size was normal in size  Pericardium: There is no evidence of pericardial effusion.  Mitral Valve: The mitral valve is normal in structure. Mild mitral valve regurgitation. No evidence of mitral valve stenosis by observation.  Tricuspid Valve: The tricuspid valve is normal in structure. Tricuspid valve regurgitation is mild.  Aortic Valve: The aortic valve is normal in structure. Aortic valve regurgitation is not visualized. The aortic valve is structurally normal, with no evidence of sclerosis or stenosis.  Pulmonic Valve: The pulmonic valve was normal in structure. Pulmonic valve regurgitation is not visualized. Pulmonic regurgitation is not visualized.  Aorta: The aortic root, ascending aorta and aortic arch are all structurally normal, with no evidence of dilitation or obstruction.  Venous: The inferior vena cava is normal in size with greater than 50% respiratory variability, suggesting right atrial pressure of 3 mmHg.  IAS/Shunts: No atrial level shunt detected by color flow Doppler. There is no evidence of a patent foramen ovale. No ventricular septal defect is seen or detected. There is no evidence of an atrial septal defect.  Cardiac Cath: Conclusion    Mid RCA lesion is 100% stenosed.  A drug-eluting stent was successfully placed using a STENT RESOLUTE ONYX 4.0X26.  Post intervention, there is a 0% residual stenosis.  RPAV lesion is 99% stenosed.  Balloon angioplasty was performed using a BALLOON SAPPHIRE 2.5X12.  Post intervention, there is a 30% residual stenosis.  1st Mrg lesion is 90% stenosed.  Mid LAD lesion is 100% stenosed. This is a CTO with retrograde  filling from the diagonal, antegrade into the mid to distal LAD.  Ost LAD to Prox LAD lesion is 50% stenosed.  Mid Cx lesion is 25% stenosed.  2nd Mrg lesion is 25% stenosed.  Dist Cx lesion is 80% stenosed. This is a small vessel.  The left ventricular systolic function is normal.  LV end diastolic pressure is mildly elevated.  The left ventricular ejection fraction is 55-65% by visual estimate.  There is no aortic valve stenosis.   Continue DAPT and initiate aggressive secondary prevention.  Watch in ICU.  Consider CTO PCI of the LAD in 4 weeks once he has recovered  from this MI.   Continue IV tirofiban for a few hours given heavy thrombus burden and residual thrombus in small, posterolateral artery branches.     Patient Profile     59 y.o. male smoker presenting with inferoposterior STEMI  Assessment & Plan    1.  Acute inferoposterior STEMI: Doing very well now greater than 24 hours out from presentation and PCI.  Continue aspirin and ticagrelor, high intensity statin drug, and low-dose beta-blocker limited by bradycardia.  Will convert him to metoprolol succinate 12.5 mg daily.  Appears stable for discharge today.  He works as a [place] [employer], return to work in 2 weeks. 2.  Tobacco abuse: Motivated to stop.  Counseling done.  Disposition: Home today.  Discussed the importance of medication adherence, complete tobacco cessation, and regular follow-up.  The patient has chronic occlusion of the LAD with plans for elective PCI in about 1 month as per Dr. Irish Lack.  For questions or updates, please contact [facility] Please consult [email or web address] for contact info under     Signed, Sherren Mocha, MD  03/22/2019, 8:53 AM

## 2019-03-23 NOTE — Telephone Encounter (Signed)
Patient contacted regarding discharge from [facility] on 03/22/2019.  Patient understands to follow up with provider Chelsea Aus, PA-c on 03/30/2019 at 9:00 at [address] in [place], [place] [address]. Patient understands discharge instructions? Yes Patient understands medications and regiment? Yes Patient understands to bring all medications to this visit? Yes

## 2019-03-26 ENCOUNTER — Telehealth (HOSPITAL_COMMUNITY): Payer: Self-pay

## 2019-03-26 NOTE — Telephone Encounter (Signed)
Due to department closure will wait to check pt's insurance once we reopen.  

## 2019-03-26 NOTE — Telephone Encounter (Signed)
Pt to have CTO in a month, will still place pt's ppw in 1/12 follow up folder.

## 2019-03-29 ENCOUNTER — Encounter: Payer: Self-pay | Admitting: Physician Assistant

## 2019-03-29 NOTE — Progress Notes (Addendum)
 Cardiology Office Note    Date:  03/30/2019   ID:  Gregory Villa, DOB 08/28/1960, MRN [id number]  PCP:  Meyers, Stephen, MD  Cardiologist:  Gregory Varanasi, MD  Chief Complaint: Hospital follow up   History of Present Illness:   Gregory Villa is a 58 y.o. male with history of tobacco abuse and recent STEMI presents for hospital follow-up.  Admitted January 2021 with inferior lateral ST elevation. Cath showed 100% stenosis in the mRCA with PCI/DES x1. Also noted to have disease in the RPAV s/p balloon angioplasty. Medically treated  CTO of the mLAD which fills via collaterals and 80% lesion in the Lcx which was noted to be a small nondominant vessel. He was started on DAPT with ASA/Brilinta. Echo showed normal EF with hypokinesis in the basal/mid inferolateral walls. Also noted to have significant LVH more pronounced in the apical segment. Plan for PCI of LAD in one month.   Here today for follow up. No recurrent angina. He is walking 30 minutes most of days/week without any limitations. He is every active. 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 The patient denies nausea, vomiting, fever, chest pain, palpitations, shortness of breath, orthopnea, PND, dizziness, syncope, cough, congestion, abdominal pain, hematochezia, melena, lower extremity edema. Compliant with his medications.   Strong family hx of CAD Father: diet suddenly at age 38, hx of DM Younger brother: hx of kidney transplant, CABG in his late 40s Elder brother: CABG in early 60s Sister: hx of extra beat  Past Medical History:  Diagnosis Date  . CAD (coronary artery disease), native coronary artery    a. cath 03/2019 s/p DES to mRCA and angioplast only to RPAV, medical therapy for CTO of LAD adn 80% small non dominant LCx  . Hyperlipidemia LDL goal <70   . Tobacco abuse    Current Medications: Prior to Admission medications   Medication Sig Start Date End Date Taking? Authorizing Provider  aspirin 81  MG chewable tablet Chew 1 tablet (81 mg total) by mouth daily. 03/23/19   Roberts, Lindsay B, NP  atorvastatin (LIPITOR) 80 MG tablet Take 1 tablet (80 mg total) by mouth daily at 6 PM. 03/22/19   Roberts, Lindsay B, NP  metoprolol succinate (TOPROL-XL) 25 MG 24 hr tablet Take 0.5 tablets (12.5 mg total) by mouth daily. 03/23/19   Roberts, Lindsay B, NP  nitroGLYCERIN (NITROSTAT) 0.4 MG SL tablet Place 1 tablet (0.4 mg total) under the tongue every 5 (five) minutes as needed. 03/22/19   Roberts, Lindsay B, NP  ticagrelor (BRILINTA) 90 MG TABS tablet Take 1 tablet (90 mg total) by mouth 2 (two) times daily. 03/22/19   Roberts, Lindsay B, NP    Allergies:   Patient has no known allergies.   Social History   Socioeconomic History  . Marital status: Divorced    Spouse name: Not on file  . Number of children: Not on file  . Years of education: Not on file  . Highest education level: Not on file  Occupational History  . Not on file  Tobacco Use  . Smoking status: Former Smoker  . Smokeless tobacco: Never Used  Substance and Sexual Activity  . Alcohol use: Not on file  . Drug use: Not on file  . Sexual activity: Not on file  Other Topics Concern  . Not on file  Social History Narrative  . Not on file   Social Determinants of Health   Financial Resource Strain:   .   Difficulty of Paying Living Expenses: Not on file  Food Insecurity:   . Worried About Programme researcher, broadcasting/film/video in the Last Year: Not on file  . Ran Out of Food in the Last Year: Not on file  Transportation Needs:   . Lack of Transportation (Medical): Not on file  . Lack of Transportation (Non-Medical): Not on file  Physical Activity:   . Days of Exercise per Week: Not on file  . Minutes of Exercise per Session: Not on file  Stress:   . Feeling of Stress : Not on file  Social Connections:   . Frequency of Communication with Friends and Family: Not on file  . Frequency of Social Gatherings with Friends and Family: Not on file  .  Attends Religious Services: Not on file  . Active Member of Clubs or Organizations: Not on file  . Attends Banker Meetings: Not on file  . Marital Status: Not on file     Family History:  The patient's family history includes CAD in his brother.   ROS:   Please see the history of present illness.    ROS All other systems reviewed and are negative.   PHYSICAL EXAM:   VS:  BP 118/84   Pulse 84   Ht 5\' 6"  (1.676 m)   Wt 204 lb (92.5 kg)   SpO2 98%   BMI 32.93 kg/m    GEN: Well nourished, well developed, in no acute distress  HEENT: normal  Neck: no JVD, carotid bruits, or masses Cardiac: RRR; no murmurs, rubs, or gallops,no edema  Respiratory:  clear to auscultation bilaterally, normal work of breathing GI: soft, nontender, nondistended, + BS MS: no deformity or atrophy  Skin: warm and dry, no rash Neuro:  Alert and Oriented x 3, Strength and sensation are intact Psych: euthymic mood, full affect  Wt Readings from Last 3 Encounters:  03/30/19 204 lb (92.5 kg)  03/21/19 205 lb (93 kg)      Studies/Labs Reviewed:   EKG:  EKG is not ordered today.    Recent Labs: 03/21/2019: ALT 37 03/22/2019: BUN 16; Creatinine, Ser 1.02; Hemoglobin 14.7; Magnesium 2.1; Platelets 230; Potassium 4.0; Sodium 136   Lipid Panel    Component Value Date/Time   CHOL 185 03/21/2019 0406   TRIG 106 03/21/2019 0406   HDL 33 (L) 03/21/2019 0406   CHOLHDL 5.6 03/21/2019 0406   VLDL 21 03/21/2019 0406   LDLCALC 131 (H) 03/21/2019 0406    Additional studies/ records that were reviewed today include:   Echocardiogram: 03/2019  1. Left ventricular ejection fraction, by visual estimation, is 55 to 60%. The left ventricle has normal function. There is moderately increased left ventricular hypertrophy.  2. The left ventricle has no regional wall motion abnormalities.  3. Global right ventricle has mildly reduced systolic function.The right ventricular size is mildly enlarged. No  increase in right ventricular wall thickness.  4. Left atrial size was mildly dilated.  5. Right atrial size was normal.  6. The mitral valve is normal in structure. Mild mitral valve regurgitation. No evidence of mitral stenosis.  7. The tricuspid valve is normal in structure.  8. The aortic valve is normal in structure. Aortic valve regurgitation is not visualized. No evidence of aortic valve sclerosis or stenosis.  9. The pulmonic valve was normal in structure. Pulmonic valve regurgitation is not visualized. 10. The inferior vena cava is normal in size with greater than 50% respiratory variability, suggesting right atrial pressure of  3 mmHg. 11. RVEF is mildly reduced. 12. There is hypokinesis in the basal and mid inferior and inferolateral walls, overall LVEF 55-60%. There is significant LVH more pronounced in theapical segment, a cardiac MRI is recommended to evaluate for apical form of hypertrophic cardiomyopathy.   CORONARY/GRAFT ACUTE MI REVASCULARIZATION  LEFT HEART CATH AND CORONARY ANGIOGRAPHY  Conclusion    Mid RCA lesion is 100% stenosed.  A drug-eluting stent was successfully placed using a STENT RESOLUTE ONYX 4.0X26.  Post intervention, there is a 0% residual stenosis.  RPAV lesion is 99% stenosed.  Balloon angioplasty was performed using a BALLOON SAPPHIRE 2.5X12.  Post intervention, there is a 30% residual stenosis.  1st Mrg lesion is 90% stenosed.  Mid LAD lesion is 100% stenosed. This is a CTO with retrograde filling from the diagonal, antegrade into the mid to distal LAD.  Ost LAD to Prox LAD lesion is 50% stenosed.  Mid Cx lesion is 25% stenosed.  2nd Mrg lesion is 25% stenosed.  Dist Cx lesion is 80% stenosed. This is a small vessel.  The left ventricular systolic function is normal.  LV end diastolic pressure is mildly elevated.  The left ventricular ejection fraction is 55-65% by visual estimate.  There is no aortic valve stenosis.   Continue  DAPT and initiate aggressive secondary prevention.  Watch in ICU.  Consider CTO PCI of the LAD in 4 weeks once he has recovered from this MI.   Continue IV tirofiban for a few hours given heavy thrombus burden and residual thrombus in small, posterolateral artery branches.     Diagnostic Dominance: Right  Intervention       ASSESSMENT & PLAN:    1. CAD s/p DES to [facility] and angioplasty to RAPV No angina. Continue exercise. Compliant with medications. Continue ASA, Brillinta, statin and BB. He does have residual CAD >> will schedule for CTO of LAD (cath lab wants to discuss with Dr. Irish Lack, staff message sent, for now cancelled cath on 2/4). Strong family hx of CAD. LVEF 55-60%.    The patient understands that risks include but are not limited to stroke (1 in 1000), death (1 in 41), kidney failure [usually temporary] (1 in 500), bleeding (1 in 200), allergic reaction [possibly serious] (1 in 200), and agrees to proceed.   2. HLD - 03/21/2019: Cholesterol 185; HDL 33; LDL Cholesterol 131; Triglycerides 106; VLDL 21  - Continue high intensity statin - Will get LFTs and Lipid panel during next office visit  3. Tobacco abuse  - Congratulated on cessation.   I have spent over 45 minutes, face-to-face with the patient and over 50% was spent in counseling and/or coordination of care. This includes review of prior records,  need of magine agent, developing & discussing different plans, discussion of disease and its complications, life style changes.   Medication Adjustments/Labs and Tests Ordered: Current medicines are reviewed at length with the patient today.  Concerns regarding medicines are outlined above.  Medication changes, Labs and Tests ordered today are listed in the Patient Instructions below. Patient Instructions  Medication Instructions:   Your physician recommends that you continue on your current medications as directed. Please refer to the Current Medication list  given to you today.  *If you need a refill on your cardiac medications before your next appointment, please call your pharmacy*  Lab Work: NONE ORDERED  TODAY   If you have labs (blood work) drawn today and your tests are completely normal, you will receive your results  only by: Marland Kitchen MyChart Message (if you have MyChart) OR . A paper copy in the mail If you have any lab test that is abnormal or we need to change your treatment, we will call you to review the results.  Testing/Procedures:  NONE ORDERED  TODAY     Follow-Up: At [facility], you and your health needs are our priority.  As part of our continuing mission to provide you with exceptional heart care, we have created designated Provider Care Teams.  These Care Teams include your primary Cardiologist (physician) and Advanced Practice Providers (APPs -  Physician Assistants and Nurse Practitioners) who all work together to provide you with the care you need, when you need it.  Your next appointment:  YOU WILL BE CONTACTED BACK WITH DATES  FOR  RECOMMENDED CATHERIZATION COVID SCREEN AND OTHER DOCUMENTS THAT MAY BE NEEDED    Other Instructions     Lorelei Pont, PA  03/30/2019 10:19 AM    [facility] [address] [place], [place], [place]  [address] Phone: [phone]; Fax: [phone]

## 2019-03-29 NOTE — H&P (View-Only) (Signed)
Cardiology Office Note    Date:  03/30/2019   ID:  Gregory Villa, DOB [birth year], MRN [id number]  PCP:  Joycelyn Rua, MD  Cardiologist:  Lance Muss, MD  Chief Complaint: Hospital follow up   History of Present Illness:   Gregory Villa is a 59 y.o. male with history of tobacco abuse and recent STEMI presents for hospital follow-up.  Admitted January 2021 with inferior lateral ST elevation. Cath showed 100% stenosis in the mRCA with PCI/DES x1. Also noted to have disease in the RPAV s/p balloon angioplasty. Medically treated  CTO of the mLAD which fills via collaterals and 80% lesion in the Lcx which was noted to be a small nondominant vessel. He was started on DAPT with ASA/Brilinta. Echo showed normal EF with hypokinesis in the basal/mid inferolateral walls. Also noted to have significant LVH more pronounced in the apical segment. Plan for PCI of LAD in one month.   Here today for follow up. No recurrent angina. He is walking 30 minutes most of days/week without any limitations. He is every active. 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 The patient denies nausea, vomiting, fever, chest pain, palpitations, shortness of breath, orthopnea, PND, dizziness, syncope, cough, congestion, abdominal pain, hematochezia, melena, lower extremity edema. Compliant with his medications.   Strong family hx of CAD Father: diet suddenly at age 50, hx of DM Younger brother: hx of kidney transplant, CABG in his late 34s Elder brother: CABG in early 3s Sister: hx of extra beat  Past Medical History:  Diagnosis Date  . CAD (coronary artery disease), native coronary artery    a. cath 03/2019 s/p DES to [facility] and angioplast only to RPAV, medical therapy for CTO of LAD adn 80% small non dominant LCx  . Hyperlipidemia LDL goal <70   . Tobacco abuse    Current Medications: Prior to Admission medications   Medication Sig Start Date End Date Taking? Authorizing Provider  aspirin 81  MG chewable tablet Chew 1 tablet (81 mg total) by mouth daily. 03/23/19   Arty Baumgartner, NP  atorvastatin (LIPITOR) 80 MG tablet Take 1 tablet (80 mg total) by mouth daily at 6 PM. 03/22/19   Arty Baumgartner, NP  metoprolol succinate (TOPROL-XL) 25 MG 24 hr tablet Take 0.5 tablets (12.5 mg total) by mouth daily. 03/23/19   Arty Baumgartner, NP  nitroGLYCERIN (NITROSTAT) 0.4 MG SL tablet Place 1 tablet (0.4 mg total) under the tongue every 5 (five) minutes as needed. 03/22/19   Arty Baumgartner, NP  ticagrelor (BRILINTA) 90 MG TABS tablet Take 1 tablet (90 mg total) by mouth 2 (two) times daily. 03/22/19   Arty Baumgartner, NP    Allergies:   Patient has no known allergies.   Social History   Socioeconomic History  . Marital status: Divorced    Spouse name: Not on file  . Number of children: Not on file  . Years of education: Not on file  . Highest education level: Not on file  Occupational History  . Not on file  Tobacco Use  . Smoking status: Former Games developer  . Smokeless tobacco: Never Used  Substance and Sexual Activity  . Alcohol use: Not on file  . Drug use: Not on file  . Sexual activity: Not on file  Other Topics Concern  . Not on file  Social History Narrative  . Not on file   Social Determinants of Health   Financial Resource Strain:   .  Difficulty of Paying Living Expenses: Not on file  Food Insecurity:   . Worried About Programme researcher, broadcasting/film/video in the Last Year: Not on file  . Ran Out of Food in the Last Year: Not on file  Transportation Needs:   . Lack of Transportation (Medical): Not on file  . Lack of Transportation (Non-Medical): Not on file  Physical Activity:   . Days of Exercise per Week: Not on file  . Minutes of Exercise per Session: Not on file  Stress:   . Feeling of Stress : Not on file  Social Connections:   . Frequency of Communication with Friends and Family: Not on file  . Frequency of Social Gatherings with Friends and Family: Not on file  .  Attends Religious Services: Not on file  . Active Member of Clubs or Organizations: Not on file  . Attends Banker Meetings: Not on file  . Marital Status: Not on file     Family History:  The patient's family history includes CAD in his brother.   ROS:   Please see the history of present illness.    ROS All other systems reviewed and are negative.   PHYSICAL EXAM:   VS:  BP 118/84   Pulse 84   Ht 5\' 6"  (1.676 m)   Wt 204 lb (92.5 kg)   SpO2 98%   BMI 32.93 kg/m    GEN: Well nourished, well developed, in no acute distress  HEENT: normal  Neck: no JVD, carotid bruits, or masses Cardiac: RRR; no murmurs, rubs, or gallops,no edema  Respiratory:  clear to auscultation bilaterally, normal work of breathing GI: soft, nontender, nondistended, + BS MS: no deformity or atrophy  Skin: warm and dry, no rash Neuro:  Alert and Oriented x 3, Strength and sensation are intact Psych: euthymic mood, full affect  Wt Readings from Last 3 Encounters:  03/30/19 204 lb (92.5 kg)  03/21/19 205 lb (93 kg)      Studies/Labs Reviewed:   EKG:  EKG is not ordered today.    Recent Labs: 03/21/2019: ALT 37 03/22/2019: BUN 16; Creatinine, Ser 1.02; Hemoglobin 14.7; Magnesium 2.1; Platelets 230; Potassium 4.0; Sodium 136   Lipid Panel    Component Value Date/Time   CHOL 185 03/21/2019 0406   TRIG 106 03/21/2019 0406   HDL 33 (L) 03/21/2019 0406   CHOLHDL 5.6 03/21/2019 0406   VLDL 21 03/21/2019 0406   LDLCALC 131 (H) 03/21/2019 0406    Additional studies/ records that were reviewed today include:   Echocardiogram: 03/2019  1. Left ventricular ejection fraction, by visual estimation, is 55 to 60%. The left ventricle has normal function. There is moderately increased left ventricular hypertrophy.  2. The left ventricle has no regional wall motion abnormalities.  3. Global right ventricle has mildly reduced systolic function.The right ventricular size is mildly enlarged. No  increase in right ventricular wall thickness.  4. Left atrial size was mildly dilated.  5. Right atrial size was normal.  6. The mitral valve is normal in structure. Mild mitral valve regurgitation. No evidence of mitral stenosis.  7. The tricuspid valve is normal in structure.  8. The aortic valve is normal in structure. Aortic valve regurgitation is not visualized. No evidence of aortic valve sclerosis or stenosis.  9. The pulmonic valve was normal in structure. Pulmonic valve regurgitation is not visualized. 10. The inferior vena cava is normal in size with greater than 50% respiratory variability, suggesting right atrial pressure of  3 mmHg. 11. RVEF is mildly reduced. 12. There is hypokinesis in the basal and mid inferior and inferolateral walls, overall LVEF 55-60%. There is significant LVH more pronounced in theapical segment, a cardiac MRI is recommended to evaluate for apical form of hypertrophic cardiomyopathy.   CORONARY/GRAFT ACUTE MI REVASCULARIZATION  LEFT HEART CATH AND CORONARY ANGIOGRAPHY  Conclusion    Mid RCA lesion is 100% stenosed.  A drug-eluting stent was successfully placed using a STENT RESOLUTE ONYX 4.0X26.  Post intervention, there is a 0% residual stenosis.  RPAV lesion is 99% stenosed.  Balloon angioplasty was performed using a BALLOON SAPPHIRE 2.5X12.  Post intervention, there is a 30% residual stenosis.  1st Mrg lesion is 90% stenosed.  Mid LAD lesion is 100% stenosed. This is a CTO with retrograde filling from the diagonal, antegrade into the mid to distal LAD.  Ost LAD to Prox LAD lesion is 50% stenosed.  Mid Cx lesion is 25% stenosed.  2nd Mrg lesion is 25% stenosed.  Dist Cx lesion is 80% stenosed. This is a small vessel.  The left ventricular systolic function is normal.  LV end diastolic pressure is mildly elevated.  The left ventricular ejection fraction is 55-65% by visual estimate.  There is no aortic valve stenosis.   Continue  DAPT and initiate aggressive secondary prevention.  Watch in ICU.  Consider CTO PCI of the LAD in 4 weeks once he has recovered from this MI.   Continue IV tirofiban for a few hours given heavy thrombus burden and residual thrombus in small, posterolateral artery branches.     Diagnostic Dominance: Right  Intervention       ASSESSMENT & PLAN:    1. CAD s/p DES to [facility] and angioplasty to RAPV No angina. Continue exercise. Compliant with medications. Continue ASA, Brillinta, statin and BB. He does have residual CAD >> will schedule for CTO of LAD (cath lab wants to discuss with Dr. Irish Lack, staff message sent, for now cancelled cath on 2/4). Strong family hx of CAD. LVEF 55-60%.    The patient understands that risks include but are not limited to stroke (1 in 1000), death (1 in 41), kidney failure [usually temporary] (1 in 500), bleeding (1 in 200), allergic reaction [possibly serious] (1 in 200), and agrees to proceed.   2. HLD - 03/21/2019: Cholesterol 185; HDL 33; LDL Cholesterol 131; Triglycerides 106; VLDL 21  - Continue high intensity statin - Will get LFTs and Lipid panel during next office visit  3. Tobacco abuse  - Congratulated on cessation.   I have spent over 45 minutes, face-to-face with the patient and over 50% was spent in counseling and/or coordination of care. This includes review of prior records,  need of magine agent, developing & discussing different plans, discussion of disease and its complications, life style changes.   Medication Adjustments/Labs and Tests Ordered: Current medicines are reviewed at length with the patient today.  Concerns regarding medicines are outlined above.  Medication changes, Labs and Tests ordered today are listed in the Patient Instructions below. Patient Instructions  Medication Instructions:   Your physician recommends that you continue on your current medications as directed. Please refer to the Current Medication list  given to you today.  *If you need a refill on your cardiac medications before your next appointment, please call your pharmacy*  Lab Work: NONE ORDERED  TODAY   If you have labs (blood work) drawn today and your tests are completely normal, you will receive your results  only by: Marland Kitchen MyChart Message (if you have MyChart) OR . A paper copy in the mail If you have any lab test that is abnormal or we need to change your treatment, we will call you to review the results.  Testing/Procedures:  NONE ORDERED  TODAY     Follow-Up: At [facility], you and your health needs are our priority.  As part of our continuing mission to provide you with exceptional heart care, we have created designated Provider Care Teams.  These Care Teams include your primary Cardiologist (physician) and Advanced Practice Providers (APPs -  Physician Assistants and Nurse Practitioners) who all work together to provide you with the care you need, when you need it.  Your next appointment:  YOU WILL BE CONTACTED BACK WITH DATES  FOR  RECOMMENDED CATHERIZATION COVID SCREEN AND OTHER DOCUMENTS THAT MAY BE NEEDED    Other Instructions     Lorelei Pont, PA  03/30/2019 10:19 AM    [facility] [address] [place], [place], [place]  [address] Phone: [phone]; Fax: [phone]

## 2019-03-30 ENCOUNTER — Ambulatory Visit: Payer: Commercial Managed Care - PPO | Admitting: Physician Assistant

## 2019-03-30 ENCOUNTER — Other Ambulatory Visit: Payer: Self-pay

## 2019-03-30 ENCOUNTER — Encounter: Payer: Self-pay | Admitting: Physician Assistant

## 2019-03-30 ENCOUNTER — Encounter: Payer: Self-pay | Admitting: *Deleted

## 2019-03-30 ENCOUNTER — Telehealth: Payer: Self-pay | Admitting: *Deleted

## 2019-03-30 VITALS — BP 118/84 | HR 84 | Ht 66.0 in | Wt 204.0 lb

## 2019-03-30 DIAGNOSIS — Z720 Tobacco use: Secondary | ICD-10-CM | POA: Diagnosis not present

## 2019-03-30 DIAGNOSIS — E785 Hyperlipidemia, unspecified: Secondary | ICD-10-CM

## 2019-03-30 DIAGNOSIS — I25110 Atherosclerotic heart disease of native coronary artery with unstable angina pectoris: Secondary | ICD-10-CM

## 2019-03-30 NOTE — Patient Instructions (Addendum)
Medication Instructions:   Your physician recommends that you continue on your current medications as directed. Please refer to the Current Medication list given to you today.  *If you need a refill on your cardiac medications before your next appointment, please call your pharmacy*  Lab Work: NONE ORDERED  TODAY   If you have labs (blood work) drawn today and your tests are completely normal, you will receive your results only by: Marland Kitchen MyChart Message (if you have MyChart) OR . A paper copy in the mail If you have any lab test that is abnormal or we need to change your treatment, we will call you to review the results.  Testing/Procedures: NONE ORDERED  TODAY    Follow-Up: At [facility], you and your health needs are our priority.  As part of our continuing mission to provide you with exceptional heart care, we have created designated Provider Care Teams.  These Care Teams include your primary Cardiologist (physician) and Advanced Practice Providers (APPs -  Physician Assistants and Nurse Practitioners) who all work together to provide you with the care you need, when you need it.  Your next appointment:  YOU WILL BE CONTACTED BACK WITH DATES  FOR  RECOMMENDED CATHERIZATION COVID SCREEN AND OTHER DOCUMENTS THAT MAY BE NEEDED    Other Instructions

## 2019-03-30 NOTE — Telephone Encounter (Signed)
Spoke with patient about appointments and instructions on procedure 04-28-19 and can be picked up on the first floor in an envelope with his name on it  in the am.

## 2019-03-30 NOTE — Addendum Note (Signed)
Addended by: Oleta Mouse on: 03/30/2019 11:42 AM   Modules accepted: Orders

## 2019-04-01 ENCOUNTER — Telehealth: Payer: Self-pay

## 2019-04-01 NOTE — Telephone Encounter (Signed)
-----   Message from Corky Crafts, MD sent at 03/30/2019  5:48 PM EST ----- Would schedule him for 2/10 on CTO day.  Thanks. JV ----- Message ----- From: Daleen Bo I, RN Sent: 03/30/2019  10:20 AM EST To: Corky Crafts, MD, #  Lequita Asal,  Dr. Hoyle Barr next CTO day is 2/10. Dr. [place] and Dr. Eldridge Dace usually tag team the CTOs and Dr. [place] is not in the lab on 2/4. I will forward this message to Dr. Eldridge Dace to advise on cMRI.  Dr. Eldridge Dace please see message below from [facility].  Thanks, [place] ----- Message ----- From: Manson Passey, PA Sent: 03/30/2019   9:55 AM EST To: Lattie Haw, RN  I have scheduled him for CTO of PCI of LAD on 2/4 with you but cath lab wants to check with you for appropriate date. Also discharge summary mention about cMRI but patient states Dr. Excell Seltzer did not mention during admission. I have briefly discussed with him. Does he needs cMRI before surgery?

## 2019-04-01 NOTE — Telephone Encounter (Signed)
Patient was seen in the office on 1/12 with [facility], PA and was set up for CTO on 2/10.

## 2019-04-02 ENCOUNTER — Telehealth: Payer: Self-pay | Admitting: Physician Assistant

## 2019-04-02 NOTE — Telephone Encounter (Signed)
Per [place] PA, okay to write letter. Typed up letter and faxed to number provided.

## 2019-04-02 NOTE — Telephone Encounter (Signed)
Patient calling requesting a letter to go back to work for 1/18. He would like it faxed to: [phone]

## 2019-04-20 ENCOUNTER — Telehealth: Payer: Self-pay | Admitting: Interventional Cardiology

## 2019-04-20 ENCOUNTER — Other Ambulatory Visit (HOSPITAL_COMMUNITY): Payer: Commercial Managed Care - PPO

## 2019-04-20 NOTE — Telephone Encounter (Signed)
New Message     [place] [facility] is calling to inform that they have the prior auth for the upcoming procedure and she will be uploading it into the patients chart

## 2019-04-20 NOTE — Telephone Encounter (Signed)
Called and spoke to [place] with [employer]. She states that the patient has been approved for the CTO Intervention on 2/10. Authorization # [id number]. Reference # [id number]

## 2019-04-22 ENCOUNTER — Telehealth: Payer: Self-pay | Admitting: Interventional Cardiology

## 2019-04-22 ENCOUNTER — Encounter (HOSPITAL_COMMUNITY): Payer: Self-pay

## 2019-04-22 ENCOUNTER — Ambulatory Visit (HOSPITAL_COMMUNITY): Admit: 2019-04-22 | Payer: Commercial Managed Care - PPO | Admitting: Interventional Cardiology

## 2019-04-22 SURGERY — LEFT HEART CATH AND CORONARY ANGIOGRAPHY
Anesthesia: LOCAL

## 2019-04-22 NOTE — Telephone Encounter (Signed)
Patient has some short term disability papers that he needs filled out and signed and would like to drop them off at the office - please advise.

## 2019-04-22 NOTE — Telephone Encounter (Signed)
I will forward note to HIM Dept as well as FYI to Dr.Varanasi's nurse.

## 2019-04-23 NOTE — Telephone Encounter (Signed)
Called and spoke to patient. He states that he dropped off his paperwork a couple of weeks ago and has not heard anything. It looks like paperwork was scanned in under Media tab on 04/20/19. Will send to [facility] in Medical Records to check on status.

## 2019-04-26 ENCOUNTER — Other Ambulatory Visit (HOSPITAL_COMMUNITY): Payer: Commercial Managed Care - PPO

## 2019-04-26 ENCOUNTER — Other Ambulatory Visit: Payer: Self-pay

## 2019-04-26 ENCOUNTER — Other Ambulatory Visit (HOSPITAL_COMMUNITY): Payer: Self-pay

## 2019-04-26 ENCOUNTER — Ambulatory Visit (INDEPENDENT_AMBULATORY_CARE_PROVIDER_SITE_OTHER): Payer: Commercial Managed Care - PPO | Admitting: Interventional Cardiology

## 2019-04-26 ENCOUNTER — Other Ambulatory Visit (HOSPITAL_COMMUNITY)
Admission: RE | Admit: 2019-04-26 | Discharge: 2019-04-26 | Disposition: A | Discharge status: Home / Self Care | Payer: Commercial Managed Care - PPO | Source: Ambulatory Visit | Attending: Interventional Cardiology | Admitting: Interventional Cardiology

## 2019-04-26 ENCOUNTER — Other Ambulatory Visit: Payer: Commercial Managed Care - PPO | Admitting: *Deleted

## 2019-04-26 VITALS — BP 124/80 | HR 52 | Wt 205.8 lb

## 2019-04-26 DIAGNOSIS — Z20822 Contact with and (suspected) exposure to covid-19: Secondary | ICD-10-CM | POA: Insufficient documentation

## 2019-04-26 DIAGNOSIS — I25110 Atherosclerotic heart disease of native coronary artery with unstable angina pectoris: Secondary | ICD-10-CM

## 2019-04-26 DIAGNOSIS — I252 Old myocardial infarction: Secondary | ICD-10-CM

## 2019-04-26 DIAGNOSIS — Z01812 Encounter for preprocedural laboratory examination: Secondary | ICD-10-CM | POA: Insufficient documentation

## 2019-04-26 LAB — BASIC METABOLIC PANEL
BUN/Creatinine Ratio: 15 (ref 9–20)
BUN: 15 mg/dL (ref 6–24)
CO2: 18 mmol/L — ABNORMAL LOW (ref 20–29)
Calcium: 8.7 mg/dL (ref 8.7–10.2)
Chloride: 104 mmol/L (ref 96–106)
Creatinine, Ser: 0.97 mg/dL (ref 0.76–1.27)
GFR calc Af Amer: 100 mL/min/{1.73_m2} (ref >59)
GFR calc non Af Amer: 86 mL/min/{1.73_m2} (ref >59)
Glucose: 105 mg/dL — ABNORMAL HIGH (ref 65–99)
Potassium: 3.9 mmol/L (ref 3.5–5.2)
Sodium: 139 mmol/L (ref 134–144)

## 2019-04-26 LAB — CBC
Hematocrit: 41.7 % (ref 37.5–51.0)
Hemoglobin: 14.7 g/dL (ref 13.0–17.7)
MCH: 32.7 pg (ref 26.6–33.0)
MCHC: 35.3 g/dL (ref 31.5–35.7)
MCV: 93 fL (ref 79–97)
Platelets: 183 10*3/uL (ref 150–450)
RBC: 4.5 x10E6/uL (ref 4.14–5.80)
RDW: 12.5 % (ref 11.6–15.4)
WBC: 5.4 10*3/uL (ref 3.4–10.8)

## 2019-04-26 LAB — SARS CORONAVIRUS 2 (TAT 6-24 HRS): SARS Coronavirus 2: NEGATIVE

## 2019-04-26 NOTE — Progress Notes (Signed)
Pre-procedure EKG for CTO, scheduled 04/28/2019.  Dr Catalina Gravel, is requesting MD.  Pt denies complaints at this time.   Dr Eldridge Dace in to see pt and reviewed EKG.  Work excuse letter provided to pt per Dr Hoyle Barr request.  Chart routed to Dr Eldridge Dace

## 2019-04-27 ENCOUNTER — Telehealth: Payer: Self-pay | Admitting: *Deleted

## 2019-04-27 NOTE — Progress Notes (Signed)
Due to COVID testing, quarantine and post procedure restrictions, he will be out of work at least from 04/26/19-05/02/19.  If this needs to be extended based on something found at the time of the procedure, will give another note at discharge.  JV

## 2019-04-27 NOTE — Telephone Encounter (Addendum)
Pt contacted pre-CTO scheduled at [facility] for: Wednesday April 28, 2019 8:30 AM Verified arrival time and place: [facility] Main Entrance [facility]) at: 6:30 AM   No solid food after midnight prior to cath, clear liquids until 5 AM day of procedure. Contrast allergy: no  AM meds can be  taken pre-cath with sip of water including: ASA 81 mg Brilinta 90 mg  Confirmed patient has responsible adult to drive home post procedure and observe 24 hours after arriving home: yes  Currently, due to Covid-19 pandemic, only one person will be allowed with patient. Must be the same person for patient's entire stay and will be required to wear a mask. They will be asked to wait in the waiting room for the duration of the patient's stay.  Patients are required to wear a mask when they enter the hospital.      COVID-19 Pre-Screening Questions:  . In the past 7 to 10 days have you had a cough,  shortness of breath, headache, congestion, fever (100 or greater) body aches, chills, sore throat, or sudden loss of taste or sense of smell? no . Have you been around anyone with known Covid 19 in the past 7-10 days? no . Have you been around anyone who is awaiting Covid 19 test results in the past 7 to 10 days? no . Have you been around anyone who has been exposed to Covid 19, or has mentioned symptoms of Covid 19 within the past 7 to 10 days? no  I reviewed procedure/mask/visitor instructions, Covid-19 screening questions with patient, he verbalized understanding, thanked me for call.              I attempted to contact patient to review procedure instructions, received message at phone number listed, voicemail not set up, unable to leave a message.

## 2019-04-27 NOTE — Telephone Encounter (Signed)
No answer, unable to leave message °

## 2019-04-28 ENCOUNTER — Other Ambulatory Visit: Payer: Self-pay

## 2019-04-28 ENCOUNTER — Encounter (HOSPITAL_COMMUNITY)
Admission: RE | Disposition: A | Discharge status: Home / Self Care | Payer: Self-pay | Source: Home / Self Care | Attending: Interventional Cardiology

## 2019-04-28 ENCOUNTER — Encounter (HOSPITAL_COMMUNITY): Payer: Self-pay | Admitting: Interventional Cardiology

## 2019-04-28 ENCOUNTER — Ambulatory Visit (HOSPITAL_COMMUNITY)
Admission: RE | Admit: 2019-04-28 | Discharge: 2019-04-29 | Disposition: A | Discharge status: Home / Self Care | Payer: Commercial Managed Care - PPO | Attending: Interventional Cardiology | Admitting: Interventional Cardiology

## 2019-04-28 DIAGNOSIS — Z7982 Long term (current) use of aspirin: Secondary | ICD-10-CM | POA: Insufficient documentation

## 2019-04-28 DIAGNOSIS — Z87891 Personal history of nicotine dependence: Secondary | ICD-10-CM | POA: Insufficient documentation

## 2019-04-28 DIAGNOSIS — Z720 Tobacco use: Secondary | ICD-10-CM | POA: Diagnosis present

## 2019-04-28 DIAGNOSIS — Z79899 Other long term (current) drug therapy: Secondary | ICD-10-CM | POA: Insufficient documentation

## 2019-04-28 DIAGNOSIS — I209 Angina pectoris, unspecified: Secondary | ICD-10-CM | POA: Diagnosis present

## 2019-04-28 DIAGNOSIS — Z8249 Family history of ischemic heart disease and other diseases of the circulatory system: Secondary | ICD-10-CM | POA: Diagnosis not present

## 2019-04-28 DIAGNOSIS — E785 Hyperlipidemia, unspecified: Secondary | ICD-10-CM | POA: Diagnosis not present

## 2019-04-28 DIAGNOSIS — I2582 Chronic total occlusion of coronary artery: Secondary | ICD-10-CM | POA: Diagnosis not present

## 2019-04-28 DIAGNOSIS — I252 Old myocardial infarction: Secondary | ICD-10-CM | POA: Diagnosis not present

## 2019-04-28 DIAGNOSIS — Z955 Presence of coronary angioplasty implant and graft: Secondary | ICD-10-CM | POA: Diagnosis not present

## 2019-04-28 DIAGNOSIS — I25118 Atherosclerotic heart disease of native coronary artery with other forms of angina pectoris: Secondary | ICD-10-CM | POA: Insufficient documentation

## 2019-04-28 DIAGNOSIS — I2510 Atherosclerotic heart disease of native coronary artery without angina pectoris: Secondary | ICD-10-CM | POA: Diagnosis present

## 2019-04-28 DIAGNOSIS — Z95820 Peripheral vascular angioplasty status with implants and grafts: Secondary | ICD-10-CM

## 2019-04-28 HISTORY — PX: LEFT HEART CATH AND CORONARY ANGIOGRAPHY: CATH118249

## 2019-04-28 HISTORY — PX: CORONARY CTO INTERVENTION: CATH118236

## 2019-04-28 HISTORY — PX: CORONARY BALLOON ANGIOPLASTY: CATH118233

## 2019-04-28 LAB — POCT ACTIVATED CLOTTING TIME
Activated Clotting Time: 285 seconds
Activated Clotting Time: 301 seconds
Activated Clotting Time: 219 seconds
Activated Clotting Time: 279 seconds
Activated Clotting Time: 285 seconds
Activated Clotting Time: 197 seconds
Activated Clotting Time: 219 seconds
Activated Clotting Time: 175 seconds

## 2019-04-28 SURGERY — CORONARY CTO INTERVENTION
Anesthesia: LOCAL

## 2019-04-28 MED ORDER — SODIUM CHLORIDE 0.9% FLUSH: 3.0000 mL | INTRAVENOUS | Status: DC | PRN

## 2019-04-28 MED ORDER — ATORVASTATIN CALCIUM 80 MG PO TABS
80.0000 mg | ORAL_TABLET | Freq: Every day | ORAL | Status: DC
  Administered 2019-04-28: 17:00:00 80 mg via ORAL
  Filled 2019-04-28: qty 1

## 2019-04-28 MED ORDER — FENTANYL CITRATE (PF) 100 MCG/2ML IJ SOLN
INTRAMUSCULAR | Status: DC | PRN
  Administered 2019-04-28 (×2): 25 ug via INTRAVENOUS

## 2019-04-28 MED ORDER — SODIUM CHLORIDE 0.9 % WEIGHT BASED INFUSION: 1.0000 mL/kg/h | INTRAVENOUS | Status: AC

## 2019-04-28 MED ORDER — LABETALOL HCL 5 MG/ML IV SOLN: 10.0000 mg | INTRAVENOUS | Status: AC | PRN

## 2019-04-28 MED ORDER — HYDRALAZINE HCL 20 MG/ML IJ SOLN: 10.0000 mg | INTRAMUSCULAR | Status: AC | PRN

## 2019-04-28 MED ORDER — LIDOCAINE HCL (PF) 1 % IJ SOLN
INTRAMUSCULAR | Status: AC
  Filled 2019-04-28: qty 30

## 2019-04-28 MED ORDER — ANGIOPLASTY BOOK
Freq: Once | Status: AC
  Filled 2019-04-28: qty 1

## 2019-04-28 MED ORDER — ONDANSETRON HCL 4 MG/2ML IJ SOLN: 4.0000 mg | Freq: Four times a day (QID) | INTRAMUSCULAR | Status: DC | PRN

## 2019-04-28 MED ORDER — MIDAZOLAM HCL 2 MG/2ML IJ SOLN
INTRAMUSCULAR | Status: AC
  Filled 2019-04-28: qty 2

## 2019-04-28 MED ORDER — SODIUM CHLORIDE 0.9 % IV SOLN: 250.0000 mL | INTRAVENOUS | Status: DC | PRN

## 2019-04-28 MED ORDER — HEPARIN SODIUM (PORCINE) 1000 UNIT/ML IJ SOLN
INTRAMUSCULAR | Status: AC
  Filled 2019-04-28: qty 1

## 2019-04-28 MED ORDER — HEPARIN (PORCINE) IN NACL 1000-0.9 UT/500ML-% IV SOLN
INTRAVENOUS | Status: AC
  Filled 2019-04-28: qty 500

## 2019-04-28 MED ORDER — HEPARIN (PORCINE) IN NACL 1000-0.9 UT/500ML-% IV SOLN
INTRAVENOUS | Status: DC | PRN
  Administered 2019-04-28 (×3): 500 mL

## 2019-04-28 MED ORDER — IOHEXOL 350 MG/ML SOLN
INTRAVENOUS | Status: AC
  Filled 2019-04-28: qty 1

## 2019-04-28 MED ORDER — ASPIRIN 81 MG PO CHEW: 81.0000 mg | CHEWABLE_TABLET | ORAL | Status: DC

## 2019-04-28 MED ORDER — ACETAMINOPHEN 325 MG PO TABS
650.0000 mg | ORAL_TABLET | ORAL | Status: DC | PRN
  Administered 2019-04-29: 650 mg via ORAL
  Filled 2019-04-28: qty 2

## 2019-04-28 MED ORDER — ASPIRIN 81 MG PO CHEW
81.0000 mg | CHEWABLE_TABLET | Freq: Every day | ORAL | Status: DC
  Administered 2019-04-29: 81 mg via ORAL
  Filled 2019-04-28: qty 1

## 2019-04-28 MED ORDER — SODIUM CHLORIDE 0.9% FLUSH: 3.0000 mL | Freq: Two times a day (BID) | INTRAVENOUS | Status: DC

## 2019-04-28 MED ORDER — HEPARIN (PORCINE) IN NACL 1000-0.9 UT/500ML-% IV SOLN
INTRAVENOUS | Status: AC
  Filled 2019-04-28: qty 1000

## 2019-04-28 MED ORDER — TICAGRELOR 90 MG PO TABS
90.0000 mg | ORAL_TABLET | Freq: Two times a day (BID) | ORAL | Status: DC
  Administered 2019-04-28 – 2019-04-29 (×2): 90 mg via ORAL
  Filled 2019-04-28 (×2): qty 1

## 2019-04-28 MED ORDER — NITROGLYCERIN 0.4 MG SL SUBL: 0.4000 mg | SUBLINGUAL_TABLET | SUBLINGUAL | Status: DC | PRN

## 2019-04-28 MED ORDER — MIDAZOLAM HCL 2 MG/2ML IJ SOLN
INTRAMUSCULAR | Status: DC | PRN
  Administered 2019-04-28: 2 mg via INTRAVENOUS
  Administered 2019-04-28: 1 mg via INTRAVENOUS

## 2019-04-28 MED ORDER — LIDOCAINE HCL (PF) 1 % IJ SOLN
INTRAMUSCULAR | Status: DC | PRN
  Administered 2019-04-28 (×2): 15 mL

## 2019-04-28 MED ORDER — NITROGLYCERIN 1 MG/10 ML FOR IR/CATH LAB
INTRA_ARTERIAL | Status: AC
  Filled 2019-04-28: qty 10

## 2019-04-28 MED ORDER — HEPARIN SODIUM (PORCINE) 1000 UNIT/ML IJ SOLN
INTRAMUSCULAR | Status: DC | PRN
  Administered 2019-04-28: 5000 [IU] via INTRAVENOUS
  Administered 2019-04-28: 2000 [IU] via INTRAVENOUS
  Administered 2019-04-28: 9000 [IU] via INTRAVENOUS
  Administered 2019-04-28: 2000 [IU] via INTRAVENOUS

## 2019-04-28 MED ORDER — SODIUM CHLORIDE 0.9% FLUSH
3.0000 mL | Freq: Two times a day (BID) | INTRAVENOUS | Status: DC
  Administered 2019-04-28: 3 mL via INTRAVENOUS

## 2019-04-28 MED ORDER — SODIUM CHLORIDE 0.9 % WEIGHT BASED INFUSION: 1.0000 mL/kg/h | INTRAVENOUS | Status: DC

## 2019-04-28 MED ORDER — TICAGRELOR 90 MG PO TABS: 90.0000 mg | ORAL_TABLET | ORAL | Status: DC

## 2019-04-28 MED ORDER — IOHEXOL 350 MG/ML SOLN
INTRAVENOUS | Status: DC | PRN
  Administered 2019-04-28: 10:00:00 210 mL

## 2019-04-28 MED ORDER — FENTANYL CITRATE (PF) 100 MCG/2ML IJ SOLN
INTRAMUSCULAR | Status: AC
  Filled 2019-04-28: qty 2

## 2019-04-28 MED ORDER — SODIUM CHLORIDE 0.9 % WEIGHT BASED INFUSION
3.0000 mL/kg/h | INTRAVENOUS | Status: DC
  Administered 2019-04-28: 08:00:00 3 mL/kg/h via INTRAVENOUS

## 2019-04-28 MED ORDER — METOPROLOL SUCCINATE ER 25 MG PO TB24
12.5000 mg | ORAL_TABLET | Freq: Every day | ORAL | Status: DC
  Administered 2019-04-28: 12.5 mg via ORAL
  Filled 2019-04-28 (×2): qty 1

## 2019-04-28 MED ORDER — NITROGLYCERIN 1 MG/10 ML FOR IR/CATH LAB
INTRA_ARTERIAL | Status: DC | PRN
  Administered 2019-04-28: 200 ug via INTRACORONARY

## 2019-04-28 SURGICAL SUPPLY — 29 items
BALLN EMERGE MR 2.0X12 (BALLOONS) ×2
BALLN EMERGE MR 2.5X15 (BALLOONS) ×2
BALLN ~~LOC~~ EMERGE MR 3.5X12 (BALLOONS) ×2
BALLOON EMERGE MR 2.0X12 (BALLOONS) IMPLANT
BALLOON EMERGE MR 2.5X15 (BALLOONS) IMPLANT
BALLOON ~~LOC~~ EMERGE MR 3.5X12 (BALLOONS) IMPLANT
CATH INFINITI 5FR AL1 (CATHETERS) ×1 IMPLANT
CATH MACH1 8FR CLS3.5 (CATHETERS) ×1 IMPLANT
CATH MAMBA 135 (CATHETERS) ×1 IMPLANT
CATH TRAPPER 6-8F (CATHETERS) ×1 IMPLANT
ELECT DEFIB PAD ADLT CADENCE (PAD) ×1 IMPLANT
GUIDEWIRE JUDO 3 190 (WIRE) ×1 IMPLANT
KIT ENCORE 26 ADVANTAGE (KITS) ×2 IMPLANT
KIT ESSENTIALS PG (KITS) ×1 IMPLANT
KIT HEART LEFT (KITS) ×3 IMPLANT
KIT HEMO VALVE WATCHDOG (MISCELLANEOUS) ×1 IMPLANT
PACK CARDIAC CATHETERIZATION (CUSTOM PROCEDURE TRAY) ×2 IMPLANT
SHEATH BRITE TIP 8FR 35CM (SHEATH) ×2 IMPLANT
SHEATH PINNACLE 5F 10CM (SHEATH) ×1 IMPLANT
SHEATH PROBE COVER 6X72 (BAG) ×1 IMPLANT
STENT SYNERGY XD 2.50X28 (Permanent Stent) IMPLANT
STENT SYNERGY XD 3.0X32 (Permanent Stent) ×1 IMPLANT
SYNERGY XD 2.50X28 (Permanent Stent) ×2 IMPLANT
TRANSDUCER W/STOPCOCK (MISCELLANEOUS) ×3 IMPLANT
TUBING CIL FLEX 10 FLL-RA (TUBING) ×2 IMPLANT
WIRE ASAHI PROWATER 180CM (WIRE) ×2 IMPLANT
WIRE COUGAR XT STRL 190CM (WIRE) ×1 IMPLANT
WIRE EMERALD 3MM-J .035X150CM (WIRE) ×1 IMPLANT
WIRE FIGHTER CROSSING 190CM (WIRE) ×1 IMPLANT

## 2019-04-28 NOTE — Interval H&P Note (Signed)
Cath Lab Visit (complete for each Cath Lab visit)  Clinical Evaluation Leading to the Procedure:   ACS: No.  Non-ACS:    Anginal Classification: CCS III  Anti-ischemic medical therapy: Minimal Therapy (1 class of medications)  Non-Invasive Test Results: No non-invasive testing performed  Prior CABG: No previous CABG  LAD CTO.  Dual groin access explained to the patient.  Plan for overnight stay.    History and Physical Interval Note:  04/28/2019 8:16 AM  Gregory Villa  has presented today for surgery, with the diagnosis of cad.  The various methods of treatment have been discussed with the patient and family. After consideration of risks, benefits and other options for treatment, the patient has consented to  Procedure(s): CORONARY CTO INTERVENTION (N/A) as a surgical intervention.  The patient's history has been reviewed, patient examined, no change in status, stable for surgery.  I have reviewed the patient's chart and labs.  Questions were answered to the patient's satisfaction.     Lance Muss

## 2019-04-28 NOTE — Progress Notes (Signed)
Site area: rt groin fa sheath Site Prior to Removal:  Level 0 Pressure Applied For: 40 minutes Manual:   yes Patient Status During Pull:  stable Post Pull Site:  Level  0 Post Pull Instructions Given:  yes Post Pull Pulses Present: rt ptr palpable Dressing Applied:  Gauze and tegaderm Bedrest begins @  Comments:

## 2019-04-28 NOTE — Progress Notes (Signed)
Site area: left groin fa sheath Site Prior to Removal: 0 Pressure Applied For 20 minutes Manual:   yes Patient Status During Pull:  stable Post Pull Site:  Level 0 Post Pull Instructions Given:  yes Post Pull Pulses Present: left pt palpable Dressing Applied:  Gauze and tegaderm Bedrest begins @ 1420 Comments:

## 2019-04-29 DIAGNOSIS — E785 Hyperlipidemia, unspecified: Secondary | ICD-10-CM | POA: Diagnosis not present

## 2019-04-29 DIAGNOSIS — E782 Mixed hyperlipidemia: Secondary | ICD-10-CM

## 2019-04-29 DIAGNOSIS — I252 Old myocardial infarction: Secondary | ICD-10-CM | POA: Diagnosis not present

## 2019-04-29 DIAGNOSIS — Z95820 Peripheral vascular angioplasty status with implants and grafts: Secondary | ICD-10-CM | POA: Diagnosis not present

## 2019-04-29 DIAGNOSIS — I2582 Chronic total occlusion of coronary artery: Secondary | ICD-10-CM | POA: Diagnosis not present

## 2019-04-29 DIAGNOSIS — I209 Angina pectoris, unspecified: Secondary | ICD-10-CM

## 2019-04-29 DIAGNOSIS — I25118 Atherosclerotic heart disease of native coronary artery with other forms of angina pectoris: Secondary | ICD-10-CM | POA: Diagnosis not present

## 2019-04-29 DIAGNOSIS — Z720 Tobacco use: Secondary | ICD-10-CM

## 2019-04-29 HISTORY — DX: Old myocardial infarction: I25.2

## 2019-04-29 HISTORY — DX: Peripheral vascular angioplasty status with implants and grafts: Z95.820

## 2019-04-29 LAB — BASIC METABOLIC PANEL
Anion gap: 8 (ref 5–15)
BUN: 12 mg/dL (ref 6–20)
CO2: 24 mmol/L (ref 22–32)
Calcium: 8.7 mg/dL — ABNORMAL LOW (ref 8.9–10.3)
Chloride: 106 mmol/L (ref 98–111)
Creatinine, Ser: 1.01 mg/dL (ref 0.61–1.24)
GFR calc Af Amer: >60 mL/min (ref >60)
GFR calc non Af Amer: >60 mL/min (ref >60)
Glucose, Bld: 111 mg/dL — ABNORMAL HIGH (ref 70–99)
Potassium: 4.2 mmol/L (ref 3.5–5.1)
Sodium: 138 mmol/L (ref 135–145)

## 2019-04-29 LAB — CBC
HCT: 41.9 % (ref 39.0–52.0)
Hemoglobin: 14.1 g/dL (ref 13.0–17.0)
MCH: 32.1 pg (ref 26.0–34.0)
MCHC: 33.7 g/dL (ref 30.0–36.0)
MCV: 95.4 fL (ref 80.0–100.0)
Platelets: 197 10*3/uL (ref 150–400)
RBC: 4.39 MIL/uL (ref 4.22–5.81)
RDW: 12.8 % (ref 11.5–15.5)
WBC: 8.5 10*3/uL (ref 4.0–10.5)
nRBC: 0 % (ref 0.0–0.2)

## 2019-04-29 NOTE — Discharge Instructions (Signed)
Call [facility] at [phone] if any bleeding, swelling or drainage at cath site.  May shower, no tub baths for 48 hours for groin sticks. No lifting over 5 pounds for 3 days.  No Driving for 3 days   Femoral Site Care This sheet gives you information about how to care for yourself after your procedure. Your health care provider may also give you more specific instructions. If you have problems or questions, contact your health care provider. What can I expect after the procedure? After the procedure, it is common to have:  Bruising that usually fades within 1-2 weeks.  Tenderness at the site. Follow these instructions at home: Wound care  Follow instructions from your health care provider about how to take care of your insertion site. Make sure you: ? Wash your hands with soap and water before you change your bandage (dressing). If soap and water are not available, use hand sanitizer. ? Change your dressing as told by your health care provider. ? Leave stitches (sutures), skin glue, or adhesive strips in place. These skin closures may need to stay in place for 2 weeks or longer. If adhesive strip edges start to loosen and curl up, you may trim the loose edges. Do not remove adhesive strips completely unless your health care provider tells you to do that.  Do not take baths, swim, or use a hot tub until your health care provider approves.  You may shower 24-48 hours after the procedure or as told by your health care provider. ? Gently wash the site with plain soap and water. ? Pat the area dry with a clean towel. ? Do not rub the site. This may cause bleeding.  Do not apply powder or lotion to the site. Keep the site clean and dry.  Check your femoral site every day for signs of infection. Check for: ? Redness, swelling, or pain. ? Fluid or blood. ? Warmth. ? Pus or a bad smell. Activity  For the first 2-3 days after your procedure, or as long as  directed: ? Avoid climbing stairs as much as possible. ? Do not squat.  Do not lift anything that is heavier than 10 lb (4.5 kg), or the limit that you are told, until your health care provider says that it is safe.  Rest as directed. ? Avoid sitting for a long time without moving. Get up to take short walks every 1-2 hours.  Do not drive for 24 hours if you were given a medicine to help you relax (sedative). General instructions  Take over-the-counter and prescription medicines only as told by your health care provider.  Keep all follow-up visits as told by your health care provider. This is important. Contact a health care provider if you have:  A fever or chills.  You have redness, swelling, or pain around your insertion site. Get help right away if:  The catheter insertion area swells very fast.  You pass out.  You suddenly start to sweat or your skin gets clammy.  The catheter insertion area is bleeding, and the bleeding does not stop when you hold steady pressure on the area.  The area near or just beyond the catheter insertion site becomes pale, cool, tingly, or numb. These symptoms may represent a serious problem that is an emergency. Do not wait to see if the symptoms will go away. Get medical help right away. Call your local emergency services (911 in the [place] Do not drive yourself to  the hospital. Summary  After the procedure, it is common to have bruising that usually fades within 1-2 weeks.  Check your femoral site every day for signs of infection.  Do not lift anything that is heavier than 10 lb (4.5 kg), or the limit that you are told, until your health care provider says that it is safe. This information is not intended to replace advice given to you by your health care provider. Make sure you discuss any questions you have with your health care provider. Document Revised: 03/17/2017 Document Reviewed: 03/17/2017 Elsevier Patient Education  [place] Heart-healthy meal planning includes:  Eating less unhealthy fats.  Eating more healthy fats.  Making other changes in your diet. Talk with your doctor or a diet specialist (dietitian) to create an eating plan that is right for you. What is my plan? Your doctor may recommend an eating plan that includes:  Total fat: ______% or less of total calories a day.  Saturated fat: ______% or less of total calories a day.  Cholesterol: less than _________mg a day. What are tips for following this plan? Cooking Avoid frying your food. Try to bake, boil, grill, or broil it instead. You can also reduce fat by:  Removing the skin from poultry.  Removing all visible fats from meats.  Steaming vegetables in water or broth. Meal planning   At meals, divide your plate into four equal parts: ? Fill one-half of your plate with vegetables and green salads. ? Fill one-fourth of your plate with whole grains. ? Fill one-fourth of your plate with lean protein foods.  Eat 4-5 servings of vegetables per day. A serving of vegetables is: ? 1 cup of raw or cooked vegetables. ? 2 cups of raw leafy greens.  Eat 4-5 servings of fruit per day. A serving of fruit is: ? 1 medium whole fruit. ?  cup of dried fruit. ?  cup of fresh, frozen, or canned fruit. ?  cup of 100% fruit juice.  Eat more foods that have soluble fiber. These are apples, broccoli, carrots, beans, peas, and barley. Try to get 20-30 g of fiber per day.  Eat 4-5 servings of nuts, legumes, and seeds per week: ? 1 serving of dried beans or legumes equals  cup after being cooked. ? 1 serving of nuts is  cup. ? 1 serving of seeds equals 1 tablespoon. General information  Eat more home-cooked food. Eat less restaurant, buffet, and fast food.  Limit or avoid alcohol.  Limit foods that are high in starch and sugar.  Avoid fried foods.  Lose weight if you are overweight.  Keep track of how  much salt (sodium) you eat. This is important if you have high blood pressure. Ask your doctor to tell you more about this.  Try to add vegetarian meals each week. Fats  Choose healthy fats. These include olive oil and canola oil, flaxseeds, walnuts, almonds, and seeds.  Eat more omega-3 fats. These include salmon, mackerel, sardines, tuna, flaxseed oil, and ground flaxseeds. Try to eat fish at least 2 times each week.  Check food labels. Avoid foods with trans fats or high amounts of saturated fat.  Limit saturated fats. ? These are often found in animal products, such as meats, butter, and cream. ? These are also found in plant foods, such as palm oil, palm kernel oil, and coconut oil.  Avoid foods with partially hydrogenated oils in them. These have trans fats.  Examples are stick margarine, some tub margarines, cookies, crackers, and other baked goods. What foods can I eat? Fruits All fresh, canned (in natural juice), or frozen fruits. Vegetables Fresh or frozen vegetables (raw, steamed, roasted, or grilled). Green salads. Grains Most grains. Choose whole wheat and whole grains most of the time. Rice and pasta, including brown rice and pastas made with whole wheat. Meats and other proteins Lean, well-trimmed beef, veal, pork, and lamb. Chicken and [place] without skin. All fish and shellfish. Wild duck, rabbit, pheasant, and venison. Egg whites or low-cholesterol egg substitutes. Dried beans, peas, lentils, and tofu. Seeds and most nuts. Dairy Low-fat or nonfat cheeses, including ricotta and mozzarella. Skim or 1% milk that is liquid, powdered, or evaporated. Buttermilk that is made with low-fat milk. Nonfat or low-fat yogurt. Fats and oils Non-hydrogenated (trans-free) margarines. Vegetable oils, including soybean, sesame, sunflower, olive, peanut, safflower, corn, canola, and cottonseed. Salad dressings or mayonnaise made with a vegetable oil. Beverages Mineral water. Coffee and  tea. Diet carbonated beverages. Sweets and desserts Sherbet, gelatin, and fruit ice. Small amounts of dark chocolate. Limit all sweets and desserts. Seasonings and condiments All seasonings and condiments. The items listed above may not be a complete list of foods and drinks you can eat. Contact a dietitian for more options. What foods should I avoid? Fruits Canned fruit in heavy syrup. Fruit in cream or butter sauce. Fried fruit. Limit coconut. Vegetables Vegetables cooked in cheese, cream, or butter sauce. Fried vegetables. Grains Breads that are made with saturated or trans fats, oils, or whole milk. Croissants. Sweet rolls. Donuts. High-fat crackers, such as cheese crackers. Meats and other proteins Fatty meats, such as hot dogs, ribs, sausage, bacon, rib-eye roast or steak. High-fat deli meats, such as salami and bologna. Caviar. Domestic duck and goose. Organ meats, such as liver. Dairy Cream, sour cream, cream cheese, and creamed cottage cheese. Whole-milk cheeses. Whole or 2% milk that is liquid, evaporated, or condensed. Whole buttermilk. Cream sauce or high-fat cheese sauce. Yogurt that is made from whole milk. Fats and oils Meat fat, or shortening. Cocoa butter, hydrogenated oils, palm oil, coconut oil, palm kernel oil. Solid fats and shortenings, including bacon fat, salt pork, lard, and butter. Nondairy cream substitutes. Salad dressings with cheese or sour cream. Beverages Regular sodas and juice drinks with added sugar. Sweets and desserts Frosting. Pudding. Cookies. Cakes. Pies. Milk chocolate or white chocolate. Buttered syrups. Full-fat ice cream or ice cream drinks. The items listed above may not be a complete list of foods and drinks to avoid. Contact a dietitian for more information. Summary  Heart-healthy meal planning includes eating less unhealthy fats, eating more healthy fats, and making other changes in your diet.  Eat a balanced diet. This includes fruits and  vegetables, low-fat or nonfat dairy, lean protein, nuts and legumes, whole grains, and heart-healthy oils and fats. This information is not intended to replace advice given to you by your health care provider. Make sure you discuss any questions you have with your health care provider. Document Revised: 05/08/2017 Document Reviewed: 04/11/2017 Elsevier Patient Education  2020 [employer].

## 2019-04-29 NOTE — Progress Notes (Signed)
CARDIAC REHAB PHASE I   PRE:  Rate/Rhythm: up in room  [address]     MODE:  Ambulation: 750 ft   POST:  Rate/Rhythm: 98 SR  BP:  Supine:   Sitting: 122/70  Standing:    SaO2: 97%RA [id number] Pt walked 750 ft with steady gait and no CP. Tolerated well. Brief review of ed since just saw pt last month. Pt has quit smoking, has been exercising, and watching his diet. Congratulated pt on this.  Will send update to [place] [facility] to notify that pt has had CTO. Pt is interested in participating in Virtual Cardiac and Pulmonary Rehab. Pt advised that Virtual Cardiac and Pulmonary Rehab is provided at no cost to the patient.  Checklist:  1. Pt has smart device  ie smartphone and/or ipad for downloading an app  Yes 2. Reliable internet/wifi service    Yes 3. Understands how to use their smartphone and navigate within an app.  Yes   Pt verbalized understanding and is in agreement.    Luetta Nutting, RN BSN  04/29/2019 8:51 AM

## 2019-04-29 NOTE — Discharge Summary (Addendum)
The patient has been seen in conjunction with Gregory Kicks, NP. All aspects of care have been considered and discussed. The patient has been personally interviewed, examined, and all clinical data has been reviewed.   Please see my earlier note on the patient.  We discussed cardiac preventive measures that will prolong survival including moderate exercise, smoking cessation, lipid lowering, blood pressure control, greater than 6 hours sleep per day, and compliance with medication regimen.  We will probably need long-term P2 Y 12 inhibitor as monotherapy after 12 months of dual antiplatelet therapy.  Discharge Summary    [name] MRN: [id number]; DOB: [birth year]  Admit date: 04/28/2019 Discharge date: 04/29/2019  Primary Care Provider: Orpah Melter, MD  Primary Cardiologist: Larae Grooms, MD  Primary Electrophysiologist:  None   Discharge Diagnoses    Principal Problem:   Coronary artery disease Active Problems:   S/P angioplasty with stent mLAD 04/28/19 DES-CTO, POBA to 2nd diag, 1st Mgr with DES    Hyperlipidemia   Tobacco use   Angina pectoris ([place])   History of acute myocardial infarction of inferior wall    Diagnostic Studies/Procedures    Cardiac cath CTO 04/28/19  Mid LAD lesion is 100% stenosed. This was a chronic total occlusion.  Successful revascularization of the chronic total occlusion with A drug-eluting stent was successfully placed using a SYNERGY XD 3.0X32.  Post intervention, there is a 0% residual stenosis.  Ost LAD to Prox LAD lesion is 50% stenosed.  2nd Diag lesion is 75% stenosed.  Balloon angioplasty was performed using a BALLOON EMERGE MR 2.0X12.  Post intervention, there is a 25% residual stenosis.  1st Mrg lesion is 90% stenosed.  A drug-eluting stent was successfully placed using a SYNERGY XD 2.50X28.  Post intervention, there is a 0% residual stenosis.  Mid Cx lesion is 25% stenosed.  2nd Mrg lesion is  25% stenosed.  Dist Cx lesion is 80% stenosed. This is a small vessel.  Prox RCA lesion is 25% stenosed.  RPAV lesion is 30% stenosed.  Previously placed Mid RCA drug eluting stent is widely patent.  LV end diastolic pressure is normal.  There is no aortic valve stenosis.  Continue dual antiplatelet therapy along with aggressive secondary prevention. He will be watched overnight given the prolonged bedrest he will need, and large bore catheter that was used in the groin. He also does not live in [place].  Results were communicated to the wife.  Diagnostic Dominance: Right  Intervention    _____________   History of Present Illness     Gregory Villa is a 59 y.o. male with history of tobacco abuse and recent Inf. Wall STEMI 03/2019 with DES to mRCA, RPAV with POBA, CTO mLAD which fills via collaterals and 80% lesion in LCX (nondominant vessel), normal EF and hypokinesis in basal/mid inferolateral walls, also  significant LVH more pronounced in the apical segment presented 04/28/19 for CTO of LAD.   See above for procedure X3.     Hospital Course     Consultants: none   Pt presented 04/28/19 for CTO procedure and he underwent without complications.  Today he has no chest pain and no SOB.  Rt groin with bruising strong post tib pulse.  Continue ASA and Brilinta.  He has plans to attend cardiac rehab.   Pt seen and evaluated by Dr. Tamala Julian and found stable for discharge.   Did the patient have an acute coronary syndrome (MI, NSTEMI, STEMI, etc) this admission?:  No  Did the patient have a percutaneous coronary intervention (stent / angioplasty)?:  Yes.     Cath/PCI Registry Performance & Quality Measures: 4. Aspirin prescribed? - Yes 5. ADP Receptor Inhibitor (Plavix/Clopidogrel, Brilinta/Ticagrelor or Effient/Prasugrel) prescribed (includes medically managed patients)? - Yes 6. High Intensity Statin (Lipitor 40-80mg  or Crestor 20-40mg )  prescribed? - Yes 7. For EF <40%, was ACEI/ARB prescribed? - Not Applicable (EF >/= 40%) 8. For EF <40%, Aldosterone Antagonist (Spironolactone or Eplerenone) prescribed? - Not Applicable (EF >/= 40%) 9. Cardiac Rehab Phase II ordered (Included Medically managed Patients)? - Yes   _____________  Discharge Vitals Blood pressure 107/64, pulse 66, temperature 100.2 F (37.9 C), temperature source Oral, resp. rate 17, height 5' 5.5" (1.664 m), weight 90.3 kg, SpO2 96 %.  Filed Weights   04/28/19 0659 04/29/19 0432  Weight: 93.4 kg 90.3 kg    Labs & Radiologic Studies    CBC Recent Labs    04/26/19 1021 04/29/19 0353  WBC 5.4 8.5  HGB 14.7 14.1  HCT 41.7 41.9  MCV 93 95.4  PLT 183 197   Basic Metabolic Panel Recent Labs    25/85/27 1021 04/29/19 0353  NA 139 138  K 3.9 4.2  CL 104 106  CO2 18* 24  GLUCOSE 105* 111*  BUN 15 12  CREATININE 0.97 1.01  CALCIUM 8.7 8.7*   Liver Function Tests No results for input(s): AST, ALT, ALKPHOS, BILITOT, PROT, ALBUMIN in the last 72 hours. No results for input(s): LIPASE, AMYLASE in the last 72 hours. High Sensitivity Troponin:   No results for input(s): TROPONINIHS in the last 720 hours.  BNP Invalid input(s): POCBNP D-Dimer No results for input(s): DDIMER in the last 72 hours. Hemoglobin A1C No results for input(s): HGBA1C in the last 72 hours. Fasting Lipid Panel No results for input(s): CHOL, HDL, LDLCALC, TRIG, CHOLHDL, LDLDIRECT in the last 72 hours. Thyroid Function Tests No results for input(s): TSH, T4TOTAL, T3FREE, THYROIDAB in the last 72 hours.  Invalid input(s): FREET3 _____________  CARDIAC CATHETERIZATION  Result Date: 04/28/2019  Mid LAD lesion is 100% stenosed. This was a chronic total occlusion.  Successful revascularization of the chronic total occlusion with A drug-eluting stent was successfully placed using a SYNERGY XD 3.0X32.  Post intervention, there is a 0% residual stenosis.  Ost LAD to Prox LAD  lesion is 50% stenosed.  2nd Diag lesion is 75% stenosed.  Balloon angioplasty was performed using a BALLOON EMERGE MR 2.0X12.  Post intervention, there is a 25% residual stenosis.  1st Mrg lesion is 90% stenosed.  A drug-eluting stent was successfully placed using a SYNERGY XD 2.50X28.  Post intervention, there is a 0% residual stenosis.  Mid Cx lesion is 25% stenosed.  2nd Mrg lesion is 25% stenosed.  Dist Cx lesion is 80% stenosed. This is a small vessel.  Prox RCA lesion is 25% stenosed.  RPAV lesion is 30% stenosed.  Previously placed Mid RCA drug eluting stent is widely patent.  LV end diastolic pressure is normal.  There is no aortic valve stenosis.  Continue dual antiplatelet therapy along with aggressive secondary prevention.  He will be watched overnight given the prolonged bedrest he will need, and large bore catheter that was used in the groin.  He also does not live in [place]. Results were communicated to the wife.   Disposition   Pt is being discharged home today in good condition.  Follow-up Plans & Appointments    Follow-up Information    Corky Crafts,  MD Follow up on 05/17/2019.   Specialties: Cardiology, Radiology, Interventional Cardiology Why: with his PA Vin. at 3:45 pm Contact information: [address] [place] [address] [phone]          Discharge Instructions    Amb Referral to Cardiac Rehabilitation   Complete by: As directed    Diagnosis: Coronary Stents   After initial evaluation and assessments completed: Virtual Based Care may be provided alone or in conjunction with Phase 2 Cardiac Rehab based on patient barriers.: Yes      Discharge Medications   Allergies as of 04/29/2019   No Known Allergies     Medication List    TAKE these medications   aspirin 81 MG chewable tablet Chew 1 tablet (81 mg total) by mouth daily.   atorvastatin 80 MG tablet Commonly known as: LIPITOR Take 1 tablet (80 mg total)  by mouth daily at 6 PM.   metoprolol succinate 25 MG 24 hr tablet Commonly known as: TOPROL-XL Take 0.5 tablets (12.5 mg total) by mouth daily.   nitroGLYCERIN 0.4 MG SL tablet Commonly known as: Nitrostat Place 1 tablet (0.4 mg total) under the tongue every 5 (five) minutes as needed.   ticagrelor 90 MG Tabs tablet Commonly known as: BRILINTA Take 1 tablet (90 mg total) by mouth 2 (two) times daily.          Outstanding Labs/Studies   Lipids   Duration of Discharge Encounter   Greater than 30 minutes including physician time.  Signed, Nada Boozer, NP 04/29/2019, 9:23 AM

## 2019-04-29 NOTE — Progress Notes (Addendum)
The patient has been seen in conjunction with Cecilie Kicks, NP. All aspects of care have been considered and discussed. The patient has been personally interviewed, examined, and all clinical data has been reviewed.   Reviewed images from March 21, 2019 and those completed yesterday.  Successful recanalization of LAD total occlusion with wire escalation technique.  LAD stented using DES and also DES placed in small ramus intermedius with good result.  Previously treated right coronary remained patent.  By femoral access sites from yesterday are without evidence of complication.  Pulses are 2+ and symmetric.  BUN, creatinine, and potassium are normal today.  Will need 12 months dual antiplatelet therapy and probable continuation beyond 12 months with monotherapy, perhaps with clopidogrel.   Progress Note  Patient Name: Gregory Villa Date of Encounter: 04/29/2019  Primary Cardiologist: Larae Grooms, MD   Subjective   No chest pain and no SOB has walked with cardiac rehab  Inpatient Medications    Scheduled Meds: . aspirin  81 mg Oral Daily  . atorvastatin  80 mg Oral q1800  . metoprolol succinate  12.5 mg Oral Daily  . sodium chloride flush  3 mL Intravenous Q12H  . sodium chloride flush  3 mL Intravenous Q12H  . ticagrelor  90 mg Oral BID   Continuous Infusions: . sodium chloride     PRN Meds: sodium chloride, acetaminophen, nitroGLYCERIN, ondansetron (ZOFRAN) IV, sodium chloride flush   Vital Signs    Vitals:   04/28/19 1635 04/28/19 1650 04/28/19 2054 04/29/19 0432  BP: 112/82 133/78 (!) 116/46 107/64  Pulse:   72 66  Resp:   16 17  Temp:   99.7 F (37.6 C) 100.2 F (37.9 C)  TempSrc:   Oral Oral  SpO2:   95% 96%  Weight:    90.3 kg  Height:        Intake/Output Summary (Last 24 hours) at 04/29/2019 0823 Last data filed at 04/28/2019 2100 Gross per 24 hour  Intake 280.2 ml  Output 350 ml  Net -69.8 ml   Last 3 Weights 04/29/2019 04/28/2019  04/26/2019  Weight (lbs) 199 lb 206 lb 205 lb 12.8 oz  Weight (kg) 90.266 kg 93.441 kg 93.35 kg      Telemetry    SR - Personally Reviewed  ECG    SR with Q waves in III and AVF no changes from admit - Personally Reviewed  Physical Exam   GEN: No acute distress.   Neck: No JVD Cardiac: RRR, no murmurs, rubs, or gallops. Rt groin cath site with bruising no hematoma , 2+ post tib puses Respiratory: Clear to auscultation bilaterally. GI: Soft, nontender, non-distended  MS: No edema; No deformity. Neuro:  Nonfocal  Psych: Normal affect   Labs    High Sensitivity Troponin:  No results for input(s): TROPONINIHS in the last 720 hours.    Chemistry Recent Labs  Lab 04/26/19 1021 04/29/19 0353  NA 139 138  K 3.9 4.2  CL 104 106  CO2 18* 24  GLUCOSE 105* 111*  BUN 15 12  CREATININE 0.97 1.01  CALCIUM 8.7 8.7*  GFRNONAA 86 >60  GFRAA 100 >60  ANIONGAP  --  8     Hematology Recent Labs  Lab 04/26/19 1021 04/29/19 0353  WBC 5.4 8.5  RBC 4.50 4.39  HGB 14.7 14.1  HCT 41.7 41.9  MCV 93 95.4  MCH 32.7 32.1  MCHC 35.3 33.7  RDW 12.5 12.8  PLT 183 197    BNPNo  results for input(s): BNP, PROBNP in the last 168 hours.   DDimer No results for input(s): DDIMER in the last 168 hours.   Radiology    CARDIAC CATHETERIZATION  Result Date: 04/28/2019  Mid LAD lesion is 100% stenosed. This was a chronic total occlusion.  Successful revascularization of the chronic total occlusion with A drug-eluting stent was successfully placed using a SYNERGY XD 3.0X32.  Post intervention, there is a 0% residual stenosis.  Ost LAD to Prox LAD lesion is 50% stenosed.  2nd Diag lesion is 75% stenosed.  Balloon angioplasty was performed using a BALLOON EMERGE MR 2.0X12.  Post intervention, there is a 25% residual stenosis.  1st Mrg lesion is 90% stenosed.  A drug-eluting stent was successfully placed using a SYNERGY XD 2.50X28.  Post intervention, there is a 0% residual stenosis.   Mid Cx lesion is 25% stenosed.  2nd Mrg lesion is 25% stenosed.  Dist Cx lesion is 80% stenosed. This is a small vessel.  Prox RCA lesion is 25% stenosed.  RPAV lesion is 30% stenosed.  Previously placed Mid RCA drug eluting stent is widely patent.  LV end diastolic pressure is normal.  There is no aortic valve stenosis.  Continue dual antiplatelet therapy along with aggressive secondary prevention.  He will be watched overnight given the prolonged bedrest he will need, and large bore catheter that was used in the groin.  He also does not live in [place]. Results were communicated to the wife.    Cardiac Studies   Cardiac cath CTO 04/28/19  Mid LAD lesion is 100% stenosed. This was a chronic total occlusion.  Successful revascularization of the chronic total occlusion with A drug-eluting stent was successfully placed using a SYNERGY XD 3.0X32.  Post intervention, there is a 0% residual stenosis.  Ost LAD to Prox LAD lesion is 50% stenosed.  2nd Diag lesion is 75% stenosed.  Balloon angioplasty was performed using a BALLOON EMERGE MR 2.0X12.  Post intervention, there is a 25% residual stenosis.  1st Mrg lesion is 90% stenosed.  A drug-eluting stent was successfully placed using a SYNERGY XD 2.50X28.  Post intervention, there is a 0% residual stenosis.  Mid Cx lesion is 25% stenosed.  2nd Mrg lesion is 25% stenosed.  Dist Cx lesion is 80% stenosed. This is a small vessel.  Prox RCA lesion is 25% stenosed.  RPAV lesion is 30% stenosed.  Previously placed Mid RCA drug eluting stent is widely patent.  LV end diastolic pressure is normal.  There is no aortic valve stenosis.   Continue dual antiplatelet therapy along with aggressive secondary prevention.  He will be watched overnight given the prolonged bedrest he will need, and large bore catheter that was used in the groin.  He also does not live in [place].  Results were communicated to the  wife.  Diagnostic Dominance: Right  Intervention    Patient Profile     59 y.o. male with history of tobacco abuse and recent Inf. Wall STEMI 03/2019 with DES to mRCA, RPAV with POBA, CTO mLAD which fills via collaterals and 80% lesion in LCX (nondominant vessel), normal EF and hypokinesis in basal/mid inferolateral walls, also  significant LVH more pronounced in the apical segment now back for CTO of LAD.  Assessment & Plan    CAD with inf wall STEMI 1.2021 with DES to mRCA and POBA to RAPV.  Continues with ASA Brilinta statin and BB.    Residual CAD with MLAD now s/p DES-CTO:  POBA  to 2nd diag; 1st Mgr with DES --stable plan to discharge has follow up 05/17/19 in office --labs stable Cr 1.01, Hgb 14.1  --EKG SR with Q waves in III and AVF similar to old and due to recent inf. MI.  --continue ASA and Brilinta  --ambulation has walked with cardiac rehab without complications  HLD on high intensity statin continue  Check lipids on next visit.   Tobacco use has stopped   FH of premature CAD.   For questions or updates, please contact [facility] Please consult [email or web address] for contact info under        Signed, Nada Boozer, NP  04/29/2019, 8:23 AM

## 2019-05-03 ENCOUNTER — Ambulatory Visit: Payer: Commercial Managed Care - PPO | Admitting: Physician Assistant

## 2019-05-14 ENCOUNTER — Telehealth (HOSPITAL_COMMUNITY): Payer: Self-pay

## 2019-05-14 NOTE — Telephone Encounter (Signed)
Called and spoke to Medical Records to check on status. They will reach out to [employer] to see where we are in this process.

## 2019-05-14 NOTE — Telephone Encounter (Signed)
Pt insurance is active and benefits verified through [facility], DED $400/$400 met, out of pocket $3,500/$2,070.40 met, co-insurance 20%. no pre-authorization required, REF# [id number]  Will contact patient to see if he is interested in the Cardiac Rehab Program. If interested, patient will need to complete follow up appt. Once completed, patient will be contacted for scheduling upon review by the RN Navigator.

## 2019-05-16 NOTE — Progress Notes (Signed)
Cardiology Office Note    Date:  05/17/2019   ID:  Gregory Villa, DOB 04/12/1960, MRN [id number]  PCP:  Orpah Melter, MD  Cardiologist:  Dr. Irish Lack   Chief Complaint: cath follow up   History of Present Illness:   Gregory Villa is a 59 y.o. male with hx CAD, hyperlipidemia, ongoing tobacco smoking presents for follow-up.  -Most recently admitted in January 2021 with inferior lateral STEMI. s/p DES to [facility] and angioplasty to RAPV. Medically treated  CTO of the mLAD which fills via collaterals and 80% lesion in the Lcx which was noted to be a small nondominant vessel. Echo showed normal EF with hypokinesis in the basal/mid inferolateral walls. Also noted to have significant LVH more pronounced in the apical segment.   Patient underwent successful  stage PCI with DES stenting to mid LAD and balloon angioplasty to second diagonal lesion April 29, 2019.  Here today for follow-up. Few days ago he ride a bicycle for 20-30 minutes without any issue. The patient denies nausea, vomiting, fever, chest pain, palpitations, shortness of breath, orthopnea, PND, dizziness, syncope, cough, congestion, abdominal pain, hematochezia, melena, lower extremity edema. Compliant with medications.    Past Medical History:  Diagnosis Date  . CAD (coronary artery disease), native coronary artery    a. cath 03/2019 s/p DES to [facility] and angioplast only to RPAV, medical therapy for CTO of LAD adn 80% small non dominant LCx  . History of acute myocardial infarction of inferior wall 04/29/2019  . Hyperlipidemia LDL goal <70   . S/P angioplasty with stent mLAD 04/28/19 DES-CTO, POBA to to 2nd diag, 1st Mgr with DES  04/29/2019  . Tobacco abuse     Past Surgical History:  Procedure Laterality Date  . CORONARY BALLOON ANGIOPLASTY N/A 04/28/2019   Procedure: CORONARY BALLOON ANGIOPLASTY;  Surgeon: Jettie Booze, MD;  Location: [place] [facility] LAB;  Service: Cardiovascular;  Laterality: N/A;  . CORONARY  CTO INTERVENTION N/A 04/28/2019   Procedure: CORONARY CTO INTERVENTION;  Surgeon: Jettie Booze, MD;  Location: [place] [facility];  Service: Cardiovascular;  Laterality: N/A;  . CORONARY/GRAFT ACUTE MI REVASCULARIZATION N/A 03/21/2019   Procedure: CORONARY/GRAFT ACUTE MI REVASCULARIZATION;  Surgeon: Jettie Booze, MD;  Location: [place] [facility];  Service: Cardiovascular;  Laterality: N/A;  . LEFT HEART CATH AND CORONARY ANGIOGRAPHY N/A 03/21/2019   Procedure: LEFT HEART CATH AND CORONARY ANGIOGRAPHY;  Surgeon: Jettie Booze, MD;  Location: [place] [facility] LAB;  Service: Cardiovascular;  Laterality: N/A;  . LEFT HEART CATH AND CORONARY ANGIOGRAPHY N/A 04/28/2019   Procedure: LEFT HEART CATH AND CORONARY ANGIOGRAPHY;  Surgeon: Jettie Booze, MD;  Location: [place] [facility];  Service: Cardiovascular;  Laterality: N/A;    Current Medications: Prior to Admission medications   Medication Sig Start Date End Date Taking? Authorizing Provider  aspirin 81 MG chewable tablet Chew 1 tablet (81 mg total) by mouth daily. 03/23/19   Cheryln Manly, NP  atorvastatin (LIPITOR) 80 MG tablet Take 1 tablet (80 mg total) by mouth daily at 6 PM. 03/22/19   Cheryln Manly, NP  metoprolol succinate (TOPROL-XL) 25 MG 24 hr tablet Take 0.5 tablets (12.5 mg total) by mouth daily. 03/23/19   Cheryln Manly, NP  nitroGLYCERIN (NITROSTAT) 0.4 MG SL tablet Place 1 tablet (0.4 mg total) under the tongue every 5 (five) minutes as needed. 03/22/19   Cheryln Manly, NP  ticagrelor (BRILINTA) 90 MG TABS tablet Take 1 tablet (  90 mg total) by mouth 2 (two) times daily. 03/22/19   Arty Baumgartner, NP    Allergies:   Patient has no known allergies.   Social History   Socioeconomic History  . Marital status: Divorced    Spouse name: Not on file  . Number of children: Not on file  . Years of education: Not on file  . Highest education level: Not on file  Occupational History  . Not on file    Tobacco Use  . Smoking status: Former Games developer  . Smokeless tobacco: Never Used  Substance and Sexual Activity  . Alcohol use: Not on file  . Drug use: Not on file  . Sexual activity: Not on file  Other Topics Concern  . Not on file  Social History Narrative  . Not on file   Social Determinants of Health   Financial Resource Strain:   . Difficulty of Paying Living Expenses: Not on file  Food Insecurity:   . Worried About Programme researcher, broadcasting/film/video in the Last Year: Not on file  . Ran Out of Food in the Last Year: Not on file  Transportation Needs:   . Lack of Transportation (Medical): Not on file  . Lack of Transportation (Non-Medical): Not on file  Physical Activity:   . Days of Exercise per Week: Not on file  . Minutes of Exercise per Session: Not on file  Stress:   . Feeling of Stress : Not on file  Social Connections:   . Frequency of Communication with Friends and Family: Not on file  . Frequency of Social Gatherings with Friends and Family: Not on file  . Attends Religious Services: Not on file  . Active Member of Clubs or Organizations: Not on file  . Attends Banker Meetings: Not on file  . Marital Status: Not on file     Family History:  The patient's family history includes CAD in his brother.  ROS:   Please see the history of present illness.    ROS All other systems reviewed and are negative.   PHYSICAL EXAM:   VS:  BP 124/82   Pulse 71   Ht 5\' 6"  (1.676 m)   Wt 205 lb 8 oz (93.2 kg)   SpO2 96%   BMI 33.17 kg/m    GEN: Well nourished, well developed, in no acute distress  HEENT: normal  Neck: no JVD, carotid bruits, or masses Cardiac: RRR; no murmurs, rubs, or gallops,no edema  Respiratory:  clear to auscultation bilaterally, normal work of breathing GI: soft, nontender, nondistended, + BS MS: no deformity or atrophy  Skin: warm and dry, no rash Neuro:  Alert and Oriented x 3, Strength and sensation are intact Psych: euthymic mood, full  affect  Wt Readings from Last 3 Encounters:  05/17/19 205 lb 8 oz (93.2 kg)  04/29/19 199 lb (90.3 kg)  04/26/19 205 lb 12.8 oz (93.4 kg)      Studies/Labs Reviewed:   EKG:  EKG is ordered today.  The ekg ordered today demonstrates NSR  Recent Labs: 03/21/2019: ALT 37 03/22/2019: Magnesium 2.1 04/29/2019: BUN 12; Creatinine, Ser 1.01; Hemoglobin 14.1; Platelets 197; Potassium 4.2; Sodium 138   Lipid Panel    Component Value Date/Time   CHOL 185 03/21/2019 0406   TRIG 106 03/21/2019 0406   HDL 33 (L) 03/21/2019 0406   CHOLHDL 5.6 03/21/2019 0406   VLDL 21 03/21/2019 0406   LDLCALC 131 (H) 03/21/2019 0406    Additional studies/  records that were reviewed today include:  Cardiac cath CTO 04/28/19  Mid LAD lesion is 100% stenosed. This was a chronic total occlusion.  Successful revascularization of the chronic total occlusion with A drug-eluting stent was successfully placed using a SYNERGY XD 3.0X32.  Post intervention, there is a 0% residual stenosis.  Ost LAD to Prox LAD lesion is 50% stenosed.  2nd Diag lesion is 75% stenosed.  Balloon angioplasty was performed using a BALLOON EMERGE MR 2.0X12.  Post intervention, there is a 25% residual stenosis.  1st Mrg lesion is 90% stenosed.  A drug-eluting stent was successfully placed using a SYNERGY XD 2.50X28.  Post intervention, there is a 0% residual stenosis.  Mid Cx lesion is 25% stenosed.  2nd Mrg lesion is 25% stenosed.  Dist Cx lesion is 80% stenosed. This is a small vessel.  Prox RCA lesion is 25% stenosed.  RPAV lesion is 30% stenosed.  Previously placed Mid RCA drug eluting stent is widely patent.  LV end diastolic pressure is normal.  There is no aortic valve stenosis.  Continue dual antiplatelet therapy along with aggressive secondary prevention. He will be watched overnight given the prolonged bedrest he will need, and large bore catheter that was used in the groin. He also does not live in  [place].  Results were communicated to the wife.  Diagnostic Dominance: Right  Intervention       ASSESSMENT & PLAN:    1. CAD s/p DES to mRCA, angioplasty to RAPV, DES  to mid LAD and balloon angioplasty to second diagonal - No angina. Continue ASA, Brillinta, statin and BB.   2. HLD - 03/21/2019: Cholesterol 185; HDL 33; LDL Cholesterol 131; Triglycerides 106; VLDL 21  - Recheck labs - Continue high intensity statin   3. Tobacco abuse - Congratulated on smoking cessation    Medication Adjustments/Labs and Tests Ordered: Current medicines are reviewed at length with the patient today.  Concerns regarding medicines are outlined above.  Medication changes, Labs and Tests ordered today are listed in the Patient Instructions below. Patient Instructions  Medication Instructions:  Your physician recommends that you continue on your current medications as directed. Please refer to the Current Medication list given to you today.  *If you need a refill on your cardiac medications before your next appointment, please call your pharmacy*   Lab Work: Tomorrow, 3/2:  COME TO THE OFFICE FOR FASTING LIPID & LFT Our lab opens as early as 7:30.  You can come anytime after that, just make sure you are fasting! NOTHING TO EAT OR DRINK AFTER MIDNIGHT THE NIGHT BEFORE.  If you have labs (blood work) drawn today and your tests are completely normal, you will receive your results only by: Marland Kitchen MyChart Message (if you have MyChart) OR . A paper copy in the mail If you have any lab test that is abnormal or we need to change your treatment, we will call you to review the results.   Testing/Procedures: None ordered   Follow-Up: At [facility], you and your health needs are our priority.  As part of our continuing mission to provide you with exceptional heart care, we have created designated Provider Care Teams.  These Care Teams include your primary Cardiologist (physician) and  Advanced Practice Providers (APPs -  Physician Assistants and Nurse Practitioners) who all work together to provide you with the care you need, when you need it.  We recommend signing up for the patient portal called "MyChart".  Sign up information is  provided on this After Visit Summary.  MyChart is used to connect with patients for Virtual Visits (Telemedicine).  Patients are able to view lab/test results, encounter notes, upcoming appointments, etc.  Non-urgent messages can be sent to your provider as well.   To learn more about what you can do with MyChart, go to [email or web address].    Your next appointment:   08/20/2019 ARRIVE AT 8:00 FOR REGISTRATION  The format for your next appointment:   In Person  Provider:   Everette Rank, MD   Other Instructions     Signed, Manson Passey, PA  05/17/2019 3:53 PM    [facility] [address] [place], [place], [place]  [address] Phone: [phone]; Fax: [phone]

## 2019-05-17 ENCOUNTER — Ambulatory Visit: Payer: Commercial Managed Care - PPO | Admitting: Physician Assistant

## 2019-05-17 ENCOUNTER — Encounter: Payer: Self-pay | Admitting: Physician Assistant

## 2019-05-17 ENCOUNTER — Other Ambulatory Visit: Payer: Self-pay

## 2019-05-17 VITALS — BP 124/82 | HR 71 | Ht 66.0 in | Wt 205.5 lb

## 2019-05-17 DIAGNOSIS — I252 Old myocardial infarction: Secondary | ICD-10-CM

## 2019-05-17 DIAGNOSIS — Z720 Tobacco use: Secondary | ICD-10-CM | POA: Diagnosis not present

## 2019-05-17 DIAGNOSIS — E785 Hyperlipidemia, unspecified: Secondary | ICD-10-CM | POA: Diagnosis not present

## 2019-05-17 DIAGNOSIS — I25110 Atherosclerotic heart disease of native coronary artery with unstable angina pectoris: Secondary | ICD-10-CM

## 2019-05-17 NOTE — Patient Instructions (Signed)
Medication Instructions:  Your physician recommends that you continue on your current medications as directed. Please refer to the Current Medication list given to you today.  *If you need a refill on your cardiac medications before your next appointment, please call your pharmacy*   Lab Work: Tomorrow, 3/2:  COME TO THE OFFICE FOR FASTING LIPID & LFT Our lab opens as early as 7:30.  You can come anytime after that, just make sure you are fasting! NOTHING TO EAT OR DRINK AFTER MIDNIGHT THE NIGHT BEFORE.  If you have labs (blood work) drawn today and your tests are completely normal, you will receive your results only by: Marland Kitchen MyChart Message (if you have MyChart) OR . A paper copy in the mail If you have any lab test that is abnormal or we need to change your treatment, we will call you to review the results.   Testing/Procedures: None ordered   Follow-Up: At [facility], you and your health needs are our priority.  As part of our continuing mission to provide you with exceptional heart care, we have created designated Provider Care Teams.  These Care Teams include your primary Cardiologist (physician) and Advanced Practice Providers (APPs -  Physician Assistants and Nurse Practitioners) who all work together to provide you with the care you need, when you need it.  We recommend signing up for the patient portal called "MyChart".  Sign up information is provided on this After Visit Summary.  MyChart is used to connect with patients for Virtual Visits (Telemedicine).  Patients are able to view lab/test results, encounter notes, upcoming appointments, etc.  Non-urgent messages can be sent to your provider as well.   To learn more about what you can do with MyChart, go to [email or web address].    Your next appointment:   08/20/2019 ARRIVE AT 8:00 FOR REGISTRATION  The format for your next appointment:   In Person  Provider:   Everette Rank, MD   Other Instructions

## 2019-05-18 ENCOUNTER — Other Ambulatory Visit: Payer: Commercial Managed Care - PPO

## 2019-05-18 DIAGNOSIS — E785 Hyperlipidemia, unspecified: Secondary | ICD-10-CM

## 2019-05-19 ENCOUNTER — Telehealth: Payer: Self-pay | Admitting: Interventional Cardiology

## 2019-05-19 LAB — HEPATIC FUNCTION PANEL
ALT: 38 IU/L (ref 0–44)
AST: 30 IU/L (ref 0–40)
Albumin: 4.1 g/dL (ref 3.8–4.9)
Alkaline Phosphatase: 101 IU/L (ref 39–117)
Bilirubin Total: 0.9 mg/dL (ref 0.0–1.2)
Bilirubin, Direct: 0.24 mg/dL (ref 0.00–0.40)
Total Protein: 6.7 g/dL (ref 6.0–8.5)

## 2019-05-19 LAB — LIPID PANEL
Chol/HDL Ratio: 3.2 ratio (ref 0.0–5.0)
Cholesterol, Total: 95 mg/dL — ABNORMAL LOW (ref 100–199)
HDL: 30 mg/dL — ABNORMAL LOW (ref >39)
LDL Chol Calc (NIH): 42 mg/dL (ref 0–99)
Triglycerides: 129 mg/dL (ref 0–149)
VLDL Cholesterol Cal: 23 mg/dL (ref 5–40)

## 2019-05-19 NOTE — Telephone Encounter (Signed)
FMLA/disability paperwork received from [employer] at [employer] bldg. Placed in box for Dr. Eldridge Dace to review. 05/19/19 vlm

## 2019-05-19 NOTE — Telephone Encounter (Signed)
Will have Dr. Eldridge Dace review when he is in the office on 3/5. Thanks

## 2019-05-21 ENCOUNTER — Telehealth: Payer: Self-pay | Admitting: Interventional Cardiology

## 2019-05-21 NOTE — Telephone Encounter (Signed)
Returning signed form to [employer] at [employer]. 05/21/19 vlm

## 2019-05-27 ENCOUNTER — Encounter (HOSPITAL_COMMUNITY): Payer: Self-pay

## 2019-05-27 NOTE — Telephone Encounter (Signed)
Attempted to call patient in regards to Cardiac Rehab - LM on VM Mailed letter 

## 2019-06-10 NOTE — Telephone Encounter (Signed)
Attempted multiple times to contact pt and mailed letter with no response. Closed referral

## 2019-07-12 ENCOUNTER — Other Ambulatory Visit: Payer: Self-pay | Admitting: Cardiology

## 2019-08-13 ENCOUNTER — Other Ambulatory Visit: Payer: Self-pay | Admitting: Cardiology

## 2019-08-19 NOTE — Progress Notes (Signed)
Cardiology Office Note   Date:  08/20/2019   ID:  Gregory Villa, DOB 1960-05-30, MRN [id number]  PCP:  Orpah Melter, MD    No chief complaint on file.  CAD/Old MI  Wt Readings from Last 3 Encounters:  08/20/19 192 lb 3.2 oz (87.2 kg)  05/17/19 205 lb 8 oz (93.2 kg)  04/29/19 199 lb (90.3 kg)       History of Present Illness:  Gregory Villa is a 59 y.o. male with a history of acute inferior ST elevation MI in January 2021.  Cardiac cath at that time showed: Mid RCA lesion is 100% stenosed.  A drug-eluting stent was successfully placed using a STENT RESOLUTE ONYX 4.0X26.  Post intervention, there is a 0% residual stenosis.  RPAV lesion is 99% stenosed.  Balloon angioplasty was performed using a BALLOON SAPPHIRE 2.5X12.  Post intervention, there is a 30% residual stenosis.  1st Mrg lesion is 90% stenosed.  Mid LAD lesion is 100% stenosed. This is a CTO with retrograde filling from the diagonal, antegrade into the mid to distal LAD.  Ost LAD to Prox LAD lesion is 50% stenosed.  Mid Cx lesion is 25% stenosed.  2nd Mrg lesion is 25% stenosed.  Dist Cx lesion is 80% stenosed. This is a small vessel.  The left ventricular systolic function is normal.  LV end diastolic pressure is mildly elevated.  The left ventricular ejection fraction is 55-65% by visual estimate. There is no aortic valve stenosis.   In February 2021, he had an elective CTO PCI of the LAD.  Catheter results showed: "Mid LAD lesion is 100% stenosed. This was a chronic total occlusion.  Successful revascularization of the chronic total occlusion with A drug-eluting stent was successfully placed using a SYNERGY XD 3.0X32.  Post intervention, there is a 0% residual stenosis.  Ost LAD to Prox LAD lesion is 50% stenosed.  2nd Diag lesion is 75% stenosed.  Balloon angioplasty was performed using a BALLOON EMERGE MR 2.0X12.  Post intervention, there is a 25% residual stenosis.  1st Mrg  lesion is 90% stenosed.  A drug-eluting stent was successfully placed using a SYNERGY XD 2.50X28. Post intervention, there is a 0% residual stenosis."  Since the last visit, he has has some nuisance bleeding when he cuts himself and bleeding hemorrhoids.   He noted that his [facility] improved after the CTO of the LAD.     Denies : Chest pain. Dizziness. Leg edema. Nitroglycerin use. Orthopnea. Palpitations. Paroxysmal nocturnal dyspnea. Shortness of breath. Syncope.   Exercises at home, 4-5 days a week.     Past Medical History:  Diagnosis Date  . CAD (coronary artery disease), native coronary artery    a. cath 03/2019 s/p DES to [facility] and angioplast only to RPAV, medical therapy for CTO of LAD adn 80% small non dominant LCx  . History of acute myocardial infarction of inferior wall 04/29/2019  . Hyperlipidemia LDL goal <70   . S/P angioplasty with stent mLAD 04/28/19 DES-CTO, POBA to to 2nd diag, 1st Mgr with DES  04/29/2019  . Tobacco abuse     Past Surgical History:  Procedure Laterality Date  . CORONARY BALLOON ANGIOPLASTY N/A 04/28/2019   Procedure: CORONARY BALLOON ANGIOPLASTY;  Surgeon: Jettie Booze, MD;  Location: [place] [facility];  Service: Cardiovascular;  Laterality: N/A;  . CORONARY CTO INTERVENTION N/A 04/28/2019   Procedure: CORONARY CTO INTERVENTION;  Surgeon: Jettie Booze, MD;  Location: [place] [facility];  Service:  Cardiovascular;  Laterality: N/A;  . CORONARY/GRAFT ACUTE MI REVASCULARIZATION N/A 03/21/2019   Procedure: CORONARY/GRAFT ACUTE MI REVASCULARIZATION;  Surgeon: Corky Crafts, MD;  Location: [facility] INVASIVE [facility] LAB;  Service: Cardiovascular;  Laterality: N/A;  . LEFT HEART CATH AND CORONARY ANGIOGRAPHY N/A 03/21/2019   Procedure: LEFT HEART CATH AND CORONARY ANGIOGRAPHY;  Surgeon: Corky Crafts, MD;  Location: [facility] INVASIVE CV LAB;  Service: Cardiovascular;  Laterality: N/A;  . LEFT HEART CATH AND CORONARY ANGIOGRAPHY N/A 04/28/2019   Procedure:  LEFT HEART CATH AND CORONARY ANGIOGRAPHY;  Surgeon: Corky Crafts, MD;  Location: [facility] INVASIVE [facility] LAB;  Service: Cardiovascular;  Laterality: N/A;     Current Outpatient Medications  Medication Sig Dispense Refill  . aspirin 81 MG chewable tablet Chew 1 tablet (81 mg total) by mouth daily. 90 tablet 1  . atorvastatin (LIPITOR) 80 MG tablet TAKE 1 TABLET BY MOUTH DAILY AT 6PM 90 tablet 1  . metoprolol succinate (TOPROL-XL) 25 MG 24 hr tablet TAKE 1/2 TABLET BY MOUTH DAILY 45 tablet 0  . nitroGLYCERIN (NITROSTAT) 0.4 MG SL tablet Place 1 tablet (0.4 mg total) under the tongue every 5 (five) minutes as needed. 25 tablet 2  . ticagrelor (BRILINTA) 90 MG TABS tablet Take 1 tablet (90 mg total) by mouth 2 (two) times daily. 180 tablet 2   No current facility-administered medications for this visit.    Allergies:   Patient has no known allergies.    Social History:  The patient  reports that he has quit smoking. He has never used smokeless tobacco.   Family History:  The patient's family history includes CAD in his brother.    ROS:  Please see the history of present illness.   Otherwise, review of systems are positive for intentional weight loss.   All other systems are reviewed and negative.    PHYSICAL EXAM: VS:  BP 126/84   Pulse (!) 58   Ht 5\' 6"  (1.676 m)   Wt 192 lb 3.2 oz (87.2 kg)   SpO2 97%   BMI 31.02 kg/m  , BMI Body mass index is 31.02 kg/m. GEN: Well nourished, well developed, in no acute distress  HEENT: normal  Neck: no JVD, carotid bruits, or masses Cardiac: RRR; no murmurs, rubs, or gallops,no edema  Respiratory:  clear to auscultation bilaterally, normal work of breathing GI: soft, nontender, nondistended, + BS MS: no deformity or atrophy  Skin: warm and dry, no rash Neuro:  Strength and sensation are intact Psych: euthymic mood, full affect    Recent Labs: 03/22/2019: Magnesium 2.1 04/29/2019: BUN 12; Creatinine, Ser 1.01; Hemoglobin 14.1; Platelets  197; Potassium 4.2; Sodium 138 05/18/2019: ALT 38   Lipid Panel    Component Value Date/Time   CHOL 95 (L) 05/18/2019 1508   TRIG 129 05/18/2019 1508   HDL 30 (L) 05/18/2019 1508   CHOLHDL 3.2 05/18/2019 1508   CHOLHDL 5.6 03/21/2019 0406   VLDL 21 03/21/2019 0406   LDLCALC 42 05/18/2019 1508     Other studies Reviewed: Additional studies/ records that were reviewed today with results demonstrating: TC in 3/21 : 95.    ASSESSMENT AND PLAN:  1. CAD/Old MI: Now with stents in all 3 coronary artery distributions.  Continue aggressive secondary prevention.  Stop aspirin, continue Brilinta for now.  If there is a lot of nuisance bleeding, then will change Brilinta to clopidogrel.  Given his multivessel stenting, I would plan to have him on long-term clopidogrel monotherapy starting in January  2022. 2. Hyperlipidemia: Continue high intensity statin.  Lipids well controlled. 3. Former smoker: He has been able to quit since his MI 5 months ago.  Maintaining.  He is doing very well.  4. We also discussed the rationale of low-dose metoprolol in the post MI setting.  He will let us know if he has any problems going forward with this medication.   Current medicines are reviewed at length with the patient today.  The patient concerns regarding his medicines were addressed.  The following changes have been made:  No change  Labs/ tests ordered today include:  No orders of the defined types were placed in this encounter.   Recommend 150 minutes/week of aerobic exercise Low fat, low carb, high fiber diet recommended  Disposition:   FU in JAN 2022   SignedLance Muss, MD  08/20/2019 8:33 AM    [facility] [address] [place], [place], [place]  [address] Phone: [phone]; Fax: [phone]

## 2019-08-20 ENCOUNTER — Ambulatory Visit: Payer: Commercial Managed Care - PPO | Admitting: Interventional Cardiology

## 2019-08-20 ENCOUNTER — Encounter: Payer: Self-pay | Admitting: Interventional Cardiology

## 2019-08-20 ENCOUNTER — Other Ambulatory Visit: Payer: Self-pay

## 2019-08-20 VITALS — BP 126/84 | HR 58 | Ht 66.0 in | Wt 192.2 lb

## 2019-08-20 DIAGNOSIS — Z87891 Personal history of nicotine dependence: Secondary | ICD-10-CM | POA: Diagnosis not present

## 2019-08-20 DIAGNOSIS — I25110 Atherosclerotic heart disease of native coronary artery with unstable angina pectoris: Secondary | ICD-10-CM | POA: Diagnosis not present

## 2019-08-20 DIAGNOSIS — I252 Old myocardial infarction: Secondary | ICD-10-CM | POA: Diagnosis not present

## 2019-08-20 DIAGNOSIS — E785 Hyperlipidemia, unspecified: Secondary | ICD-10-CM | POA: Diagnosis not present

## 2019-08-20 NOTE — Patient Instructions (Signed)
Medication Instructions:  Your physician has recommended you make the following change in your medication:   STOP: Aspirin  *If you need a refill on your cardiac medications before your next appointment, please call your pharmacy*   Lab Work: None ordered  If you have labs (blood work) drawn today and your tests are completely normal, you will receive your results only by: Marland Kitchen MyChart Message (if you have MyChart) OR . A paper copy in the mail If you have any lab test that is abnormal or we need to change your treatment, we will call you to review the results.   Testing/Procedures: None ordered   Follow-Up: At [facility], you and your health needs are our priority.  As part of our continuing mission to provide you with exceptional heart care, we have created designated Provider Care Teams.  These Care Teams include your primary Cardiologist (physician) and Advanced Practice Providers (APPs -  Physician Assistants and Nurse Practitioners) who all work together to provide you with the care you need, when you need it.  We recommend signing up for the patient portal called "MyChart".  Sign up information is provided on this After Visit Summary.  MyChart is used to connect with patients for Virtual Visits (Telemedicine).  Patients are able to view lab/test results, encounter notes, upcoming appointments, etc.  Non-urgent messages can be sent to your provider as well.   To learn more about what you can do with MyChart, go to [email or web address].    Your next appointment:   7 month(s)  The format for your next appointment:   In Person  Provider:   You may see Lance Muss, MD or one of the following Advanced Practice Providers on your designated Care Team:    Ronie Spies, PA-C  Jacolyn Reedy, PA-C    Other Instructions None

## 2019-10-04 ENCOUNTER — Other Ambulatory Visit: Payer: Self-pay | Admitting: Physician Assistant

## 2020-01-26 ENCOUNTER — Other Ambulatory Visit: Payer: Self-pay | Admitting: Interventional Cardiology

## 2020-01-26 ENCOUNTER — Other Ambulatory Visit: Payer: Self-pay | Admitting: Cardiology

## 2020-02-03 ENCOUNTER — Other Ambulatory Visit: Payer: Self-pay | Admitting: Cardiology

## 2020-05-31 NOTE — Progress Notes (Unsigned)
Cardiology Office Note   Date:  06/01/2020   ID:  Gregory Villa, DOB 30-Mar-1960, MRN [id number]  PCP:  Joycelyn Rua, MD    No chief complaint on file.  CAD  Wt Readings from Last 3 Encounters:  06/01/20 193 lb (87.5 kg)  08/20/19 192 lb 3.2 oz (87.2 kg)  05/17/19 205 lb 8 oz (93.2 kg)       History of Present Illness:  Gregory Villa is a 60 y.o. male  with a history of acute inferior ST elevation MI in January 2021.  Cardiac cath at that time showed: Mid RCA lesion is 100% stenosed.  A drug-eluting stent was successfully placed using a STENT RESOLUTE ONYX 4.0X26.  Post intervention, there is a 0% residual stenosis.  RPAV lesion is 99% stenosed.  Balloon angioplasty was performed using a BALLOON SAPPHIRE 2.5X12.  Post intervention, there is a 30% residual stenosis.  1st Mrg lesion is 90% stenosed.  Mid LAD lesion is 100% stenosed. This is a CTO with retrograde filling from the diagonal, antegrade into the mid to distal LAD.  Ost LAD to Prox LAD lesion is 50% stenosed.  Mid Cx lesion is 25% stenosed.  2nd Mrg lesion is 25% stenosed.  Dist Cx lesion is 80% stenosed. This is a small vessel.  The left ventricular systolic function is normal.  LV end diastolic pressure is mildly elevated.  The left ventricular ejection fraction is 55-65% by visual estimate. There is no aortic valve stenosis.   In February 2021, he had an elective CTO PCI of the LAD.  Catheter results showed: "Mid LAD lesion is 100% stenosed. This was a chronic total occlusion.  Successful revascularization of the chronic total occlusion with A drug-eluting stent was successfully placed using a SYNERGY XD 3.0X32.  Post intervention, there is a 0% residual stenosis.  Ost LAD to Prox LAD lesion is 50% stenosed.  2nd Diag lesion is 75% stenosed.  Balloon angioplasty was performed using a BALLOON EMERGE MR 2.0X12.  Post intervention, there is a 25% residual stenosis.  1st Mrg  lesion is 90% stenosed.  A drug-eluting stent was successfully placed using a SYNERGY XD 2.50X28. Post intervention, there is a 0% residual stenosis."  He has had some nuisance bleeding when he cuts himself and bleeding hemorrhoids.   He noted that his [facility] improved after the CTO of the LAD.     Denies : Chest pain. Dizziness. Leg edema. Nitroglycerin use. Orthopnea. Palpitations. Paroxysmal nocturnal dyspnea. Shortness of breath. Syncope.   Exercise has decreased.  Past Medical History:  Diagnosis Date  . CAD (coronary artery disease), native coronary artery    a. cath 03/2019 s/p DES to [facility] and angioplast only to RPAV, medical therapy for CTO of LAD adn 80% small non dominant LCx  . History of acute myocardial infarction of inferior wall 04/29/2019  . Hyperlipidemia LDL goal <70   . S/P angioplasty with stent mLAD 04/28/19 DES-CTO, POBA to to 2nd diag, 1st Mgr with DES  04/29/2019  . Tobacco abuse     Past Surgical History:  Procedure Laterality Date  . CORONARY BALLOON ANGIOPLASTY N/A 04/28/2019   Procedure: CORONARY BALLOON ANGIOPLASTY;  Surgeon: Corky Crafts, MD;  Location: [facility] INVASIVE CV LAB;  Service: Cardiovascular;  Laterality: N/A;  . CORONARY CTO INTERVENTION N/A 04/28/2019   Procedure: CORONARY CTO INTERVENTION;  Surgeon: Corky Crafts, MD;  Location: [facility] INVASIVE CV LAB;  Service: Cardiovascular;  Laterality: N/A;  . CORONARY/GRAFT ACUTE MI REVASCULARIZATION N/A  03/21/2019   Procedure: CORONARY/GRAFT ACUTE MI REVASCULARIZATION;  Surgeon: Corky Crafts, MD;  Location: [facility] INVASIVE CV LAB;  Service: Cardiovascular;  Laterality: N/A;  . LEFT HEART CATH AND CORONARY ANGIOGRAPHY N/A 03/21/2019   Procedure: LEFT HEART CATH AND CORONARY ANGIOGRAPHY;  Surgeon: Corky Crafts, MD;  Location: [facility] INVASIVE CV LAB;  Service: Cardiovascular;  Laterality: N/A;  . LEFT HEART CATH AND CORONARY ANGIOGRAPHY N/A 04/28/2019   Procedure: LEFT HEART CATH AND CORONARY  ANGIOGRAPHY;  Surgeon: Corky Crafts, MD;  Location: [facility] INVASIVE CV LAB;  Service: Cardiovascular;  Laterality: N/A;     Current Outpatient Medications  Medication Sig Dispense Refill  . atorvastatin (LIPITOR) 80 MG tablet TAKE 1 TABLET BY MOUTH DAILY AT 6PM 90 tablet 2  . BRILINTA 90 MG TABS tablet TAKE 1 TABLET BY MOUTH TWO TIMES DAILY 180 tablet 0  . nitroGLYCERIN (NITROSTAT) 0.4 MG SL tablet Place 1 tablet (0.4 mg total) under the tongue every 5 (five) minutes as needed. 25 tablet 2   No current facility-administered medications for this visit.    Allergies:   Patient has no known allergies.    Social History:  The patient  reports that he has quit smoking. He has never used smokeless tobacco.   Family History:  The patient's *family history includes CAD in his brother.    ROS:  Please see the history of present illness.   Otherwise, review of systems are positive for an occasional sweet tooth.   All other systems are reviewed and negative.    PHYSICAL EXAM: VS:  BP 134/82   Pulse (!) 52   Ht 5\' 6"  (1.676 m)   Wt 193 lb (87.5 kg)   SpO2 96%   BMI 31.15 kg/m  , BMI Body mass index is 31.15 kg/m. GEN: Well nourished, well developed, in no acute distress  HEENT: normal  Neck: no JVD, carotid bruits, or masses Cardiac: RRR; no murmurs, rubs, or gallops,no edema  Respiratory:  clear to auscultation bilaterally, normal work of breathing GI: soft, nontender, nondistended, + BS MS: no deformity or atrophy  Skin: warm and dry, no rash Neuro:  Strength and sensation are intact Psych: euthymic mood, full affect   EKG:   The ekg ordered today demonstrates sinus bradycardia, no ST segment changes   Recent Labs: No results found for requested labs within last 8760 hours.   Lipid Panel    Component Value Date/Time   CHOL 95 (L) 05/18/2019 1508   TRIG 129 05/18/2019 1508   HDL 30 (L) 05/18/2019 1508   CHOLHDL 3.2 05/18/2019 1508   CHOLHDL 5.6 03/21/2019 0406    VLDL 21 03/21/2019 0406   LDLCALC 42 05/18/2019 1508     Other studies Reviewed: Additional studies/ records that were reviewed today with results demonstrating: labs reviewed.   ASSESSMENT AND PLAN:  1. CAD/Old MI: No angina.  Out of Brilinta.  Start clopidogrel 75 mg daily after a 300 mg loading dose. Continue secondary prevention.  Check LFTs and electrolytes.  2. Hyperlipidemia: Whole food, plant based diet recommended.  Increase fiber intake. Continue atorvastatin.  Check lipids today. 3. Former smoker: Has not returned to smoking.    Current medicines are reviewed at length with the patient today.  The patient concerns regarding his medicines were addressed.  The following changes have been made:  No change  Labs/ tests ordered today include:  No orders of the defined types were placed in this encounter.   Recommend 150 minutes/week  of aerobic exercise Low fat, low carb, high fiber diet recommended  Disposition:   FU in 1 year    Signed, Lance Muss, MD  06/01/2020 8:18 AM    [facility] [address] [place], [place], [place]  [address] Phone: [phone]; Fax: [phone]

## 2020-06-01 ENCOUNTER — Other Ambulatory Visit: Payer: Self-pay

## 2020-06-01 ENCOUNTER — Ambulatory Visit (INDEPENDENT_AMBULATORY_CARE_PROVIDER_SITE_OTHER): Payer: Commercial Managed Care - PPO | Admitting: Interventional Cardiology

## 2020-06-01 ENCOUNTER — Encounter: Payer: Self-pay | Admitting: Interventional Cardiology

## 2020-06-01 VITALS — BP 134/82 | HR 52 | Ht 66.0 in | Wt 193.0 lb

## 2020-06-01 DIAGNOSIS — I25110 Atherosclerotic heart disease of native coronary artery with unstable angina pectoris: Secondary | ICD-10-CM

## 2020-06-01 DIAGNOSIS — I252 Old myocardial infarction: Secondary | ICD-10-CM | POA: Diagnosis not present

## 2020-06-01 DIAGNOSIS — Z87891 Personal history of nicotine dependence: Secondary | ICD-10-CM | POA: Diagnosis not present

## 2020-06-01 DIAGNOSIS — E785 Hyperlipidemia, unspecified: Secondary | ICD-10-CM | POA: Diagnosis not present

## 2020-06-01 LAB — CBC
Hematocrit: 45.8 % (ref 37.5–51.0)
Hemoglobin: 15.6 g/dL (ref 13.0–17.7)
MCH: 31.9 pg (ref 26.6–33.0)
MCHC: 34.1 g/dL (ref 31.5–35.7)
MCV: 94 fL (ref 79–97)
Platelets: 232 10*3/uL (ref 150–450)
RBC: 4.89 x10E6/uL (ref 4.14–5.80)
RDW: 12.5 % (ref 11.6–15.4)
WBC: 5.6 10*3/uL (ref 3.4–10.8)

## 2020-06-01 LAB — LIPID PANEL
Chol/HDL Ratio: 3.1 ratio (ref 0.0–5.0)
Cholesterol, Total: 95 mg/dL — ABNORMAL LOW (ref 100–199)
HDL: 31 mg/dL — ABNORMAL LOW (ref >39)
LDL Chol Calc (NIH): 46 mg/dL (ref 0–99)
Triglycerides: 89 mg/dL (ref 0–149)
VLDL Cholesterol Cal: 18 mg/dL (ref 5–40)

## 2020-06-01 LAB — COMPREHENSIVE METABOLIC PANEL
ALT: 25 IU/L (ref 0–44)
AST: 16 IU/L (ref 0–40)
Albumin/Globulin Ratio: 1.9 (ref 1.2–2.2)
Albumin: 4.3 g/dL (ref 3.8–4.9)
Alkaline Phosphatase: 91 IU/L (ref 44–121)
BUN/Creatinine Ratio: 20 (ref 9–20)
BUN: 17 mg/dL (ref 6–24)
Bilirubin Total: 0.4 mg/dL (ref 0.0–1.2)
CO2: 23 mmol/L (ref 20–29)
Calcium: 9.1 mg/dL (ref 8.7–10.2)
Chloride: 102 mmol/L (ref 96–106)
Creatinine, Ser: 0.87 mg/dL (ref 0.76–1.27)
Globulin, Total: 2.3 g/dL (ref 1.5–4.5)
Glucose: 105 mg/dL — ABNORMAL HIGH (ref 65–99)
Potassium: 4 mmol/L (ref 3.5–5.2)
Sodium: 140 mmol/L (ref 134–144)
Total Protein: 6.6 g/dL (ref 6.0–8.5)
eGFR: 99 mL/min/{1.73_m2} (ref >59)

## 2020-06-01 MED ORDER — CLOPIDOGREL BISULFATE 75 MG PO TABS: 75.0000 mg | ORAL_TABLET | Freq: Every day | ORAL | 3 refills | Status: DC

## 2020-06-01 MED ORDER — CLOPIDOGREL BISULFATE 75 MG PO TABS: ORAL_TABLET | ORAL | 0 refills | Status: DC

## 2020-06-01 NOTE — Patient Instructions (Signed)
Medication Instructions:  Your physician has recommended you make the following change in your medication: Stop Brilinta.  Start Clopidogrel 75 mg.  Take 4 tablets today then one tablet by mouth daily  *If you need a refill on your cardiac medications before your next appointment, please call your pharmacy*   Lab Work: Lab work to be done today--CBC, CMET and Lipid profile If you have labs (blood work) drawn today and your tests are completely normal, you will receive your results only by: Marland Kitchen MyChart Message (if you have MyChart) OR . A paper copy in the mail If you have any lab test that is abnormal or we need to change your treatment, we will call you to review the results.   Testing/Procedures: none   Follow-Up: At [facility], you and your health needs are our priority.  As part of our continuing mission to provide you with exceptional heart care, we have created designated Provider Care Teams.  These Care Teams include your primary Cardiologist (physician) and Advanced Practice Providers (APPs -  Physician Assistants and Nurse Practitioners) who all work together to provide you with the care you need, when you need it.  We recommend signing up for the patient portal called "MyChart".  Sign up information is provided on this After Visit Summary.  MyChart is used to connect with patients for Virtual Visits (Telemedicine).  Patients are able to view lab/test results, encounter notes, upcoming appointments, etc.  Non-urgent messages can be sent to your provider as well.   To learn more about what you can do with MyChart, go to [email or web address].    Your next appointment:   12 month(s)  The format for your next appointment:   In Person  Provider:   You may see Lance Muss, MD or one of the following Advanced Practice Providers on your designated Care Team:    Ronie Spies, PA-C  Jacolyn Reedy, PA-C    Other Instructions  High-Fiber Eating Plan Fiber, also called  dietary fiber, is a type of carbohydrate. It is found foods such as fruits, vegetables, whole grains, and beans. A high-fiber diet can have many health benefits. Your health care provider may recommend a high-fiber diet to help:  Prevent constipation. Fiber can make your bowel movements more regular.  Lower your cholesterol.  Relieve the following conditions: ? Inflammation of veins in the anus (hemorrhoids). ? Inflammation of specific areas of the digestive tract (uncomplicated diverticulosis). ? A problem of the large intestine, also called the colon, that sometimes causes pain and diarrhea (irritable bowel syndrome, or IBS).  Prevent overeating as part of a weight-loss plan.  Prevent heart disease, type 2 diabetes, and certain cancers. What are tips for following this plan? Reading food labels  Check the nutrition facts label on food products for the amount of dietary fiber. Choose foods that have 5 grams of fiber or more per serving.  The goals for recommended daily fiber intake include: ? Men (age 16 or younger): 34-38 g. ? Men (over age 22): 28-34 g. ? Women (age 91 or younger): 25-28 g. ? Women (over age 15): 22-25 g. Your daily fiber goal is _____________ g.   Shopping  Choose whole fruits and vegetables instead of processed forms, such as apple juice or applesauce.  Choose a wide variety of high-fiber foods such as avocados, lentils, oats, and kidney beans.  Read the nutrition facts label of the foods you choose. Be aware of foods with added fiber. These foods often have high  sugar and sodium amounts per serving. Cooking  Use whole-grain flour for baking and cooking.  Cook with brown rice instead of white rice. Meal planning  Start the day with a breakfast that is high in fiber, such as a cereal that contains 5 g of fiber or more per serving.  Eat breads and cereals that are made with whole-grain flour instead of refined flour or white flour.  Eat brown rice,  bulgur wheat, or millet instead of white rice.  Use beans in place of meat in soups, salads, and pasta dishes.  Be sure that half of the grains you eat each day are whole grains. General information  You can get the recommended daily intake of dietary fiber by: ? Eating a variety of fruits, vegetables, grains, nuts, and beans. ? Taking a fiber supplement if you are not able to take in enough fiber in your diet. It is better to get fiber through food than from a supplement.  Gradually increase how much fiber you consume. If you increase your intake of dietary fiber too quickly, you may have bloating, cramping, or gas.  Drink plenty of water to help you digest fiber.  Choose high-fiber snacks, such as berries, raw vegetables, nuts, and popcorn. What foods should I eat? Fruits Berries. Pears. Apples. Oranges. Avocado. Prunes and raisins. Dried figs. Vegetables Sweet potatoes. Spinach. Kale. Artichokes. Cabbage. Broccoli. Cauliflower. Green peas. Carrots. Squash. Grains Whole-grain breads. Multigrain cereal. Oats and oatmeal. Brown rice. Barley. Bulgur wheat. Millet. Quinoa. Bran muffins. Popcorn. Rye wafer crackers. Meats and other proteins Navy beans, kidney beans, and pinto beans. Soybeans. Split peas. Lentils. Nuts and seeds. Dairy Fiber-fortified yogurt. Beverages Fiber-fortified soy milk. Fiber-fortified orange juice. Other foods Fiber bars. The items listed above may not be a complete list of recommended foods and beverages. Contact a dietitian for more information. What foods should I avoid? Fruits Fruit juice. Cooked, strained fruit. Vegetables Fried potatoes. Canned vegetables. Well-cooked vegetables. Grains White bread. Pasta made with refined flour. White rice. Meats and other proteins Fatty cuts of meat. Fried chicken or fried fish. Dairy Milk. Yogurt. Cream cheese. Sour cream. Fats and oils Butters. Beverages Soft drinks. Other foods Cakes and pastries. The  items listed above may not be a complete list of foods and beverages to avoid. Talk with your dietitian about what choices are best for you. Summary  Fiber is a type of carbohydrate. It is found in foods such as fruits, vegetables, whole grains, and beans.  A high-fiber diet has many benefits. It can help to prevent constipation, lower blood cholesterol, aid weight loss, and reduce your risk of heart disease, diabetes, and certain cancers.  Increase your intake of fiber gradually. Increasing fiber too quickly may cause cramping, bloating, and gas. Drink plenty of water while you increase the amount of fiber you consume.  The best sources of fiber include whole fruits and vegetables, whole grains, nuts, seeds, and beans. This information is not intended to replace advice given to you by your health care provider. Make sure you discuss any questions you have with your health care provider. Document Revised: 07/08/2019 Document Reviewed: 07/08/2019 Elsevier Patient Education  2021 [employer].

## 2020-11-01 IMAGING — DX DG CHEST 1V PORT
1 series · 1 of 1 positions shown · non-contrast
Comparison: None.

CLINICAL DATA: Chest pain.

EXAM:
PORTABLE CHEST 1 VIEW

[chest ap]
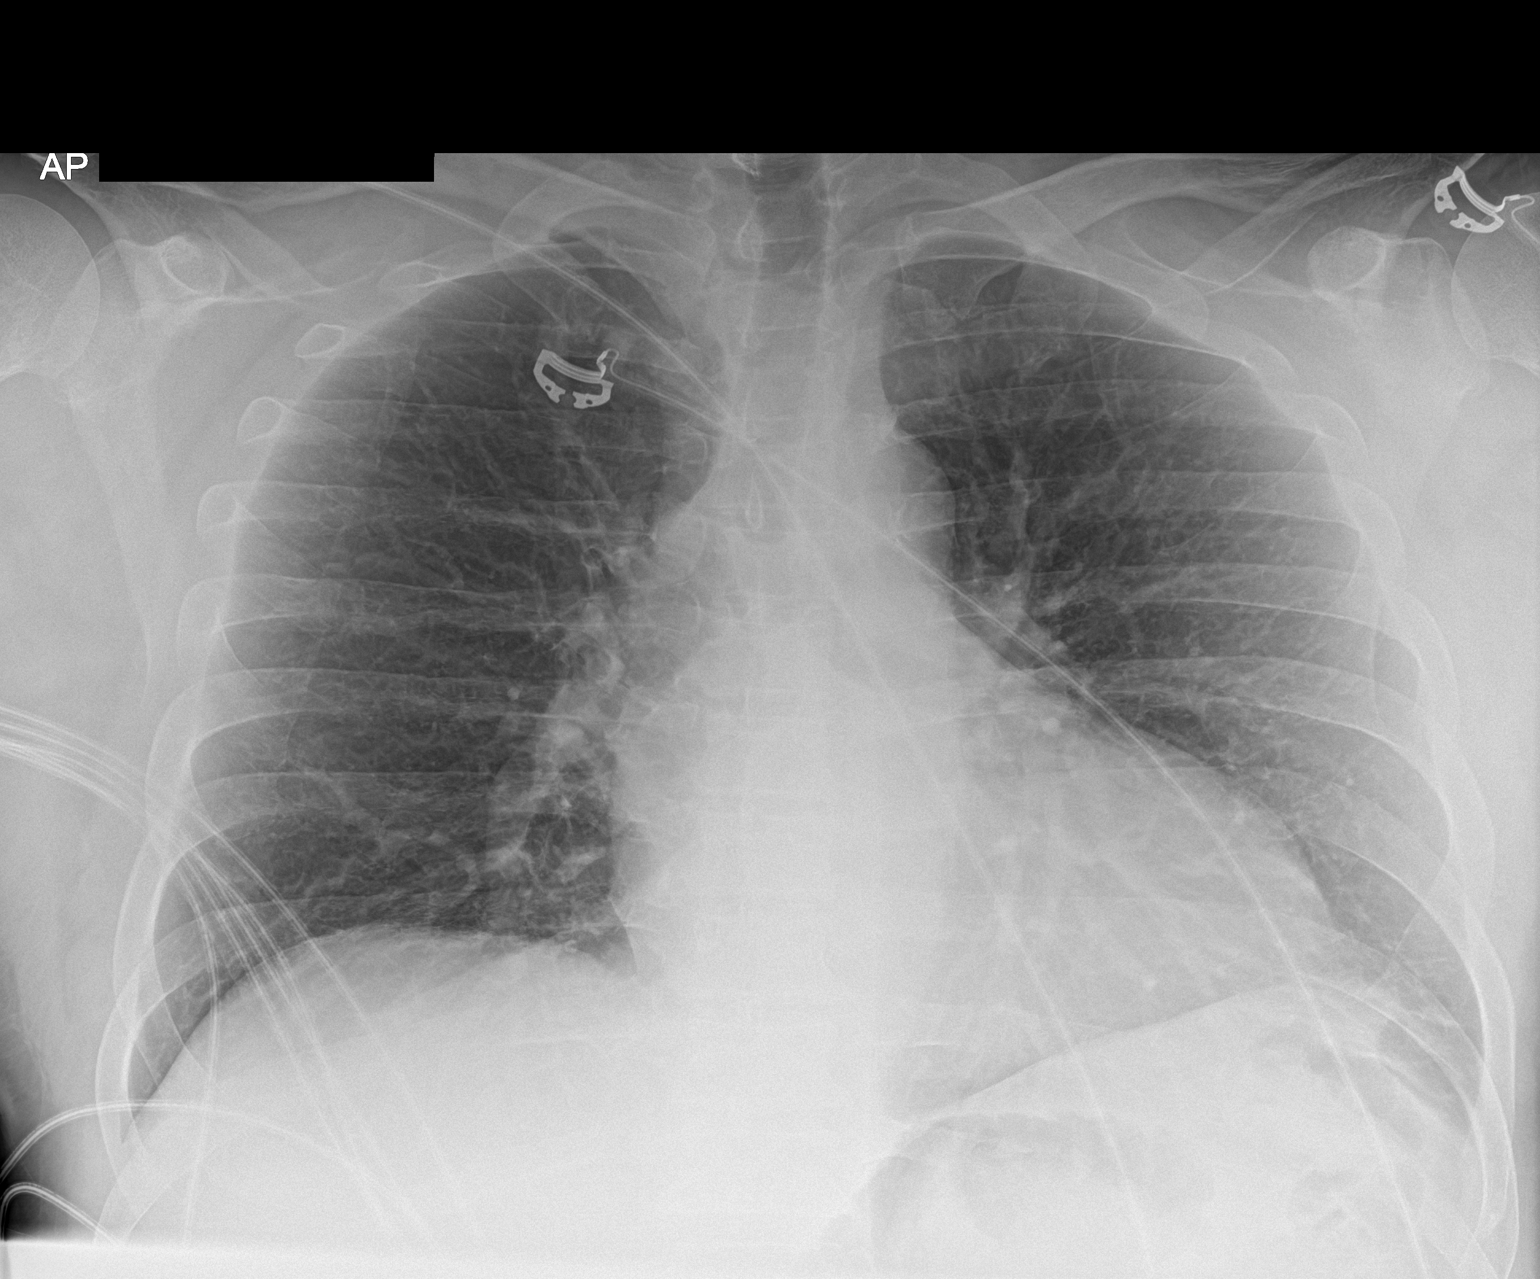

[1 of 1 positions shown; findings below may reference images not displayed]

FINDINGS: Mild cardiomegaly. Normal mediastinal contours. Mild vascular
congestion. Streaky opacities at the left lung base favor
atelectasis. No pleural effusion or pneumothorax. No acute osseous
abnormalities are seen.
IMPRESSION: 1. Mild cardiomegaly with vascular congestion.
2. Streaky opacities at the left lung base favoring atelectasis.

## 2020-12-14 ENCOUNTER — Telehealth: Payer: Self-pay | Admitting: Interventional Cardiology

## 2020-12-14 ENCOUNTER — Telehealth (HOSPITAL_COMMUNITY): Payer: Self-pay | Admitting: *Deleted

## 2020-12-14 DIAGNOSIS — I25110 Atherosclerotic heart disease of native coronary artery with unstable angina pectoris: Secondary | ICD-10-CM

## 2020-12-14 DIAGNOSIS — I252 Old myocardial infarction: Secondary | ICD-10-CM

## 2020-12-14 NOTE — Telephone Encounter (Signed)
Left message for patient to call back  

## 2020-12-14 NOTE — Telephone Encounter (Signed)
Left message for patient's wife that per Dr. Eldridge Dace we will order a stress myoview for evaluation of ischemia. I advised that she should receive a call from our office to schedule the test and she may call back with questions. I advised in the message that if patient's chest pain occurs again, that he should call 911 and proceed to ED.  Routing to Dr. Eldridge Dace to sign attestation.

## 2020-12-14 NOTE — Telephone Encounter (Signed)
Pt c/o of Chest Pain: 1. Are you having CP right now? No  2. Are you experiencing any other symptoms (ex. SOB, nausea, vomiting, sweating)? No  3. How long have you been experiencing CP?  Night before last 4. Is your CP continuous or coming and going? Continuous for a while then coming and going 5. Have you taken Nitroglycerin? No woke him last night. Patient wife calling. Patient is at work he had his wife call for him. Until he gose on break

## 2020-12-14 NOTE — Telephone Encounter (Signed)
Patient given detailed instructions per Myocardial Perfusion Study Information Sheet for the test on 12/18/20. Patient notified to arrive 15 minutes early and that it is imperative to arrive on time for appointment to keep from having the test rescheduled.  If you need to cancel or reschedule your appointment, please call the office within 24 hours of your appointment. . Patient verbalized understanding. Gregory Villa   

## 2020-12-14 NOTE — Telephone Encounter (Signed)
Received return call from patient's wife who states patient had CP on the night/early morning of 9/27-9/28. She states patient reported that he was awakened from sleep with belching and restlessness and could not go back to sleep. He took some Tums and eventually fell back asleep on the sofa. Did not go to work yesterday because of fatigue, not feeling well.  Denies n/v/d, diaphoresis. He did not take any ntg. He did not report the discomfort as "chest pain" until last night when his wife got home from work but stated that he felt better and went to work today.  Compliant with Plavix per wife. Wife reports patient's activity level has been WNL recently and denies other episodes of chest pain, SOB, or other concerns. I advised that I will discuss with Dr. Eldridge Dace  who is in the office this morning. She verbalized understanding and agreement with plan and thanked me for the call.

## 2020-12-18 ENCOUNTER — Ambulatory Visit (HOSPITAL_COMMUNITY): Payer: Commercial Managed Care - PPO | Attending: Internal Medicine

## 2020-12-18 ENCOUNTER — Other Ambulatory Visit: Payer: Self-pay

## 2020-12-18 DIAGNOSIS — I25110 Atherosclerotic heart disease of native coronary artery with unstable angina pectoris: Secondary | ICD-10-CM | POA: Insufficient documentation

## 2020-12-18 DIAGNOSIS — I252 Old myocardial infarction: Secondary | ICD-10-CM

## 2020-12-18 LAB — MYOCARDIAL PERFUSION IMAGING
Angina Index: 0
Base ST Depression (mm): 0 mm
Duke Treadmill Score: 10
Estimated workload: 11.7
Exercise duration (min): 10 min
Exercise duration (sec): 1 s
LV dias vol: 62 mL (ref 62–150)
LV sys vol: 102 mL
MPHR: 161 {beats}/min
Nuc Stress EF: 40 %
Peak HR: 160 {beats}/min
Percent HR: 99 %
RPE: 18
Rest HR: 54 {beats}/min
Rest Nuclear Isotope Dose: 10.4 mCi
SDS: 1
SRS: 1
SSS: 2
ST Depression (mm): 0 mm
Stress Nuclear Isotope Dose: 31 mCi
TID: 0.94

## 2020-12-18 MED ORDER — TECHNETIUM TC 99M TETROFOSMIN IV KIT
31.0000 | PACK | Freq: Once | INTRAVENOUS | Status: AC | PRN
  Administered 2020-12-18: 31 via INTRAVENOUS
  Filled 2020-12-18: qty 31

## 2020-12-18 MED ORDER — TECHNETIUM TC 99M TETROFOSMIN IV KIT
10.4000 | PACK | Freq: Once | INTRAVENOUS | Status: AC | PRN
  Administered 2020-12-18: 10.4 via INTRAVENOUS
  Filled 2020-12-18: qty 11

## 2020-12-22 ENCOUNTER — Telehealth: Payer: Self-pay | Admitting: *Deleted

## 2020-12-22 DIAGNOSIS — I2510 Atherosclerotic heart disease of native coronary artery without angina pectoris: Secondary | ICD-10-CM

## 2020-12-22 MED ORDER — ATORVASTATIN CALCIUM 80 MG PO TABS: ORAL_TABLET | ORAL | 3 refills | Status: DC

## 2020-12-22 NOTE — Telephone Encounter (Signed)
Patient notified.  He is agreeable to having echo done. He is feeling fine at current time.  No more chest pain. Needs refill of Atorvastatin.  Sent to pharmacy

## 2020-12-22 NOTE — Telephone Encounter (Signed)
-----   Message from Corky Crafts, MD sent at 12/21/2020  1:38 PM EDT ----- No ischemia by stress test.  Check echo as heart function looked reduced.  COuld be an artifact from the test.  Ultrasound will be more accurate.   JV ----- Message ----- From: Levi Aland, NP Sent: 12/19/2020   7:56 AM EDT To: Corky Crafts, MD

## 2021-01-08 ENCOUNTER — Other Ambulatory Visit: Payer: Self-pay

## 2021-01-08 ENCOUNTER — Ambulatory Visit (HOSPITAL_COMMUNITY): Payer: Commercial Managed Care - PPO | Attending: Cardiovascular Disease

## 2021-01-08 DIAGNOSIS — I2510 Atherosclerotic heart disease of native coronary artery without angina pectoris: Secondary | ICD-10-CM

## 2021-01-08 LAB — ECHOCARDIOGRAM COMPLETE
Area-P 1/2: 3.53 cm2
S' Lateral: 3.3 cm

## 2021-05-29 NOTE — Progress Notes (Signed)
?  ?Cardiology Office Note ? ? ?Date:  05/31/2021  ? ?ID:  Gregory Villa, DOB 1960/07/05, MRN [id number] ? ?PCP:  Gregory Rua, MD  ? ? ?Chief Complaint  ?Patient presents with  ? Follow-up  ? ?CAD ? ?Wt Readings from Last 3 Encounters:  ?05/31/21 189 lb (85.7 kg)  ?12/18/20 193 lb (87.5 kg)  ?06/01/20 193 lb (87.5 kg)  ?  ? ?  ?History of Present Illness: ?Gregory Villa is a 61 y.o. male   with a history of acute inferior ST elevation MI in January 2021.  Cardiac cath at that time showed: Mid RCA lesion is 100% stenosed. ?A drug-eluting stent was successfully placed using a STENT RESOLUTE ONYX 4.0X26. ?Post intervention, there is a 0% residual stenosis. ?RPAV lesion is 99% stenosed. ?Balloon angioplasty was performed using a BALLOON SAPPHIRE 2.5X12. ?Post intervention, there is a 30% residual stenosis. ?1st Mrg lesion is 90% stenosed. ?Mid LAD lesion is 100% stenosed. This is a CTO with retrograde filling from the diagonal, antegrade into the mid to distal LAD. ?Ost LAD to Prox LAD lesion is 50% stenosed. ?Mid Cx lesion is 25% stenosed. ?2nd Mrg lesion is 25% stenosed. ?Dist Cx lesion is 80% stenosed. This is a small vessel. ?The left ventricular systolic function is normal. ?LV end diastolic pressure is mildly elevated. ?The left ventricular ejection fraction is 55-65% by visual estimate. ?There is no aortic valve stenosis. ?  ?In February 2021, he had an elective CTO PCI of the LAD.  Catheter results showed: "Mid LAD lesion is 100% stenosed. This was a chronic total occlusion. ?Successful revascularization of the chronic total occlusion with A drug-eluting stent was successfully placed using a SYNERGY XD 3.0X32. ?Post intervention, there is a 0% residual stenosis. ?Ost LAD to Prox LAD lesion is 50% stenosed. ?2nd Diag lesion is 75% stenosed. ?Balloon angioplasty was performed using a BALLOON EMERGE MR 2.0X12. ?Post intervention, there is a 25% residual stenosis. ?1st Mrg lesion is 90% stenosed. ?A  drug-eluting stent was successfully placed using a SYNERGY XD 2.50X28. ?Post intervention, there is a 0% residual stenosis." ?  ?In the past, he had some nuisance bleeding when he cuts himself and bleeding hemorrhoids.  ?  ?He noted that his [facility] improved after the CTO of the LAD.    ? ? ? ?Past Medical History:  ?Diagnosis Date  ? CAD (coronary artery disease), native coronary artery   ? a. cath 03/2019 s/p DES to [facility] and angioplast only to RPAV, medical therapy for CTO of LAD adn 80% small non dominant LCx  ? History of acute myocardial infarction of inferior wall 04/29/2019  ? Hyperlipidemia LDL goal <70   ? S/P angioplasty with stent mLAD 04/28/19 DES-CTO, POBA to to 2nd diag, 1st Mgr with DES  04/29/2019  ? Tobacco abuse   ? ? ?Past Surgical History:  ?Procedure Laterality Date  ? CORONARY BALLOON ANGIOPLASTY N/A 04/28/2019  ? Procedure: CORONARY BALLOON ANGIOPLASTY;  Surgeon: Corky Crafts, MD;  Location: [facility] INVASIVE CV LAB;  Service: Cardiovascular;  Laterality: N/A;  ? CORONARY CTO INTERVENTION N/A 04/28/2019  ? Procedure: CORONARY CTO INTERVENTION;  Surgeon: Corky Crafts, MD;  Location: [facility] INVASIVE CV LAB;  Service: Cardiovascular;  Laterality: N/A;  ? CORONARY/GRAFT ACUTE MI REVASCULARIZATION N/A 03/21/2019  ? Procedure: CORONARY/GRAFT ACUTE MI REVASCULARIZATION;  Surgeon: Corky Crafts, MD;  Location: [facility] INVASIVE CV LAB;  Service: Cardiovascular;  Laterality: N/A;  ? LEFT HEART CATH AND CORONARY ANGIOGRAPHY N/A 03/21/2019  ? Procedure:  LEFT HEART CATH AND CORONARY ANGIOGRAPHY;  Surgeon: Corky Crafts, MD;  Location: [facility] INVASIVE CV LAB;  Service: Cardiovascular;  Laterality: N/A;  ? LEFT HEART CATH AND CORONARY ANGIOGRAPHY N/A 04/28/2019  ? Procedure: LEFT HEART CATH AND CORONARY ANGIOGRAPHY;  Surgeon: Corky Crafts, MD;  Location: [facility] INVASIVE CV LAB;  Service: Cardiovascular;  Laterality: N/A;  ? ? ? ?Current Outpatient Medications  ?Medication Sig Dispense Refill  ? atorvastatin  (LIPITOR) 80 MG tablet TAKE 1 TABLET BY MOUTH DAILY AT 6PM 90 tablet 3  ? clopidogrel (PLAVIX) 75 MG tablet Take 1 tablet (75 mg total) by mouth daily. 90 tablet 3  ? nitroGLYCERIN (NITROSTAT) 0.4 MG SL tablet Place 1 tablet (0.4 mg total) under the tongue every 5 (five) minutes as needed. 25 tablet 2  ? ?No current facility-administered medications for this visit.  ? ? ?Allergies:   Patient has no known allergies.  ? ? ?Social History:  The patient  reports that he has quit smoking. He has never been exposed to tobacco smoke. He has never used smokeless tobacco.  ? ?Family History:  The patient's family history includes CAD in his brother.  ? ? ?ROS:  Please see the history of present illness.   Otherwise, review of systems are positive for difficulty losing weight.   All other systems are reviewed and negative.  ? ? ?PHYSICAL EXAM: ?VS:  BP 122/70   Pulse (!) 57   Ht 5\' 5"  (1.651 m)   Wt 189 lb (85.7 kg)   SpO2 97%   BMI 31.45 kg/m?  , BMI Body mass index is 31.45 kg/m?. ?GEN: Well nourished, well developed, in no acute distress ?HEENT: normal ?Neck: no JVD, carotid bruits, or masses ?Cardiac: RRR; no murmurs, rubs, or gallops,no edema  ?Respiratory:  clear to auscultation bilaterally, normal work of breathing ?GI: soft, nontender, nondistended, + BS ?MS: no deformity or atrophy ?Skin: warm and dry, no rash ?Neuro:  Strength and sensation are intact ?Psych: euthymic mood, full affect ? ? ?EKG:   ?The ekg ordered today demonstrates NSR, no ST segment changes ? ? ?Recent Labs: ?06/01/2020: ALT 25; BUN 17; Creatinine, Ser 0.87; Hemoglobin 15.6; Platelets 232; Potassium 4.0; Sodium 140  ? ?Lipid Panel ?   ?Component Value Date/Time  ? CHOL 95 (L) 06/01/2020 0834  ? TRIG 89 06/01/2020 0834  ? HDL 31 (L) 06/01/2020 0834  ? CHOLHDL 3.1 06/01/2020 0834  ? CHOLHDL 5.6 03/21/2019 0406  ? VLDL 21 03/21/2019 0406  ? LDLCALC 46 06/01/2020 0834  ? ?  ?Other studies Reviewed: ?Additional studies/ records that were reviewed  today with results demonstrating: LDL 55 in 2022.  Cr normal in 2022.  ? ? ?ASSESSMENT AND PLAN: ? ?CAD/Old MI:  No angina on medical therapy.  No bleeding problems. Nuisance bleeding improved. No CHF sx. continue clopidogrel monotherapy. ?Hyperlipidemia: Whole food, plant-based diet.  High fiber intake.  Avoid processed foods.  Continue high-dose atorvastatin. ?Former smoker: Quit at time of MI.  No relapse. ?Increase activity to target below. ? ? ?Current medicines are reviewed at length with the patient today.  The patient concerns regarding his medicines were addressed. ? ?The following changes have been made:  No change ? ?Labs/ tests ordered today include:  ?No orders of the defined types were placed in this encounter. ? ? ?Recommend 150 minutes/week of aerobic exercise ?Low fat, low carb, high fiber diet recommended ? ?Disposition:   FU in 1 year ? ? ?Signed, ?2023, MD  ?  05/31/2021 1:29 PM    ?[facility] ?[address], [place], [place]  [address] ?Phone: [phone]; Fax: [phone]  ? ?

## 2021-05-31 ENCOUNTER — Ambulatory Visit (INDEPENDENT_AMBULATORY_CARE_PROVIDER_SITE_OTHER): Payer: Commercial Managed Care - PPO | Admitting: Interventional Cardiology

## 2021-05-31 ENCOUNTER — Encounter: Payer: Self-pay | Admitting: Interventional Cardiology

## 2021-05-31 ENCOUNTER — Other Ambulatory Visit: Payer: Self-pay

## 2021-05-31 VITALS — BP 122/70 | HR 57 | Ht 65.0 in | Wt 189.0 lb

## 2021-05-31 DIAGNOSIS — I2510 Atherosclerotic heart disease of native coronary artery without angina pectoris: Secondary | ICD-10-CM | POA: Diagnosis not present

## 2021-05-31 DIAGNOSIS — E785 Hyperlipidemia, unspecified: Secondary | ICD-10-CM

## 2021-05-31 DIAGNOSIS — Z87891 Personal history of nicotine dependence: Secondary | ICD-10-CM

## 2021-05-31 DIAGNOSIS — I252 Old myocardial infarction: Secondary | ICD-10-CM | POA: Diagnosis not present

## 2021-05-31 NOTE — Patient Instructions (Signed)
Medication Instructions:  Your physician recommends that you continue on your current medications as directed. Please refer to the Current Medication list given to you today.  *If you need a refill on your cardiac medications before your next appointment, please call your pharmacy*   Lab Work: none If you have labs (blood work) drawn today and your tests are completely normal, you will receive your results only by: MyChart Message (if you have MyChart) OR A paper copy in the mail If you have any lab test that is abnormal or we need to change your treatment, we will call you to review the results.   Testing/Procedures: none   Follow-Up: At [facility], you and your health needs are our priority.  As part of our continuing mission to provide you with exceptional heart care, we have created designated Provider Care Teams.  These Care Teams include your primary Cardiologist (physician) and Advanced Practice Providers (APPs -  Physician Assistants and Nurse Practitioners) who all work together to provide you with the care you need, when you need it.  We recommend signing up for the patient portal called "MyChart".  Sign up information is provided on this After Visit Summary.  MyChart is used to connect with patients for Virtual Visits (Telemedicine).  Patients are able to view lab/test results, encounter notes, upcoming appointments, etc.  Non-urgent messages can be sent to your provider as well.   To learn more about what you can do with MyChart, go to [email or web address].    Your next appointment:   12 month(s)  The format for your next appointment:   In Person  Provider:   Jayadeep Varanasi, MD     Other Instructions  High-Fiber Eating Plan Fiber, also called dietary fiber, is a type of carbohydrate. It is found foods such as fruits, vegetables, whole grains, and beans. A high-fiber diet can have many health benefits. Your health care provider may recommend a high-fiber diet  to help: Prevent constipation. Fiber can make your bowel movements more regular. Lower your cholesterol. Relieve the following conditions: Inflammation of veins in the anus (hemorrhoids). Inflammation of specific areas of the digestive tract (uncomplicated diverticulosis). A problem of the large intestine, also called the colon, that sometimes causes pain and diarrhea (irritable bowel syndrome, or IBS). Prevent overeating as part of a weight-loss plan. Prevent heart disease, type 2 diabetes, and certain cancers. What are tips for following this plan? Reading food labels  Check the nutrition facts label on food products for the amount of dietary fiber. Choose foods that have 5 grams of fiber or more per serving. The goals for recommended daily fiber intake include: Men (age 50 or younger): 34-38 g. Men (over age 50): 28-34 g. Women (age 50 or younger): 25-28 g. Women (over age 50): 22-25 g. Your daily fiber goal is _____________ g. Shopping Choose whole fruits and vegetables instead of processed forms, such as apple juice or applesauce. Choose a wide variety of high-fiber foods such as avocados, lentils, oats, and kidney beans. Read the nutrition facts label of the foods you choose. Be aware of foods with added fiber. These foods often have high sugar and sodium amounts per serving. Cooking Use whole-grain flour for baking and cooking. Cook with brown rice instead of white rice. Meal planning Start the day with a breakfast that is high in fiber, such as a cereal that contains 5 g of fiber or more per serving. Eat breads and cereals that are made with whole-grain flour instead of   refined flour or white flour. Eat brown rice, bulgur wheat, or millet instead of white rice. Use beans in place of meat in soups, salads, and pasta dishes. Be sure that half of the grains you eat each day are whole grains. General information You can get the recommended daily intake of dietary fiber  by: Eating a variety of fruits, vegetables, grains, nuts, and beans. Taking a fiber supplement if you are not able to take in enough fiber in your diet. It is better to get fiber through food than from a supplement. Gradually increase how much fiber you consume. If you increase your intake of dietary fiber too quickly, you may have bloating, cramping, or gas. Drink plenty of water to help you digest fiber. Choose high-fiber snacks, such as berries, raw vegetables, nuts, and popcorn. What foods should I eat? Fruits Berries. Pears. Apples. Oranges. Avocado. Prunes and raisins. Dried figs. Vegetables Sweet potatoes. Spinach. Kale. Artichokes. Cabbage. Broccoli. Cauliflower. Green peas. Carrots. Squash. Grains Whole-grain breads. Multigrain cereal. Oats and oatmeal. Brown rice. Barley. Bulgur wheat. Millet. Quinoa. Bran muffins. Popcorn. Rye wafer crackers. Meats and other proteins Navy beans, kidney beans, and pinto beans. Soybeans. Split peas. Lentils. Nuts and seeds. Dairy Fiber-fortified yogurt. Beverages Fiber-fortified soy milk. Fiber-fortified orange juice. Other foods Fiber bars. The items listed above may not be a complete list of recommended foods and beverages. Contact a dietitian for more information. What foods should I avoid? Fruits Fruit juice. Cooked, strained fruit. Vegetables Fried potatoes. Canned vegetables. Well-cooked vegetables. Grains White bread. Pasta made with refined flour. White rice. Meats and other proteins Fatty cuts of meat. Fried chicken or fried fish. Dairy Milk. Yogurt. Cream cheese. Sour cream. Fats and oils Butters. Beverages Soft drinks. Other foods Cakes and pastries. The items listed above may not be a complete list of foods and beverages to avoid. Talk with your dietitian about what choices are best for you. Summary Fiber is a type of carbohydrate. It is found in foods such as fruits, vegetables, whole grains, and beans. A high-fiber  diet has many benefits. It can help to prevent constipation, lower blood cholesterol, aid weight loss, and reduce your risk of heart disease, diabetes, and certain cancers. Increase your intake of fiber gradually. Increasing fiber too quickly may cause cramping, bloating, and gas. Drink plenty of water while you increase the amount of fiber you consume. The best sources of fiber include whole fruits and vegetables, whole grains, nuts, seeds, and beans. This information is not intended to replace advice given to you by your health care provider. Make sure you discuss any questions you have with your health care provider. Document Revised: 07/08/2019 Document Reviewed: 07/08/2019 Elsevier Patient Education  2022 Elsevier Inc.   

## 2021-06-28 ENCOUNTER — Other Ambulatory Visit: Payer: Self-pay | Admitting: Interventional Cardiology

## 2021-06-28 MED ORDER — CLOPIDOGREL BISULFATE 75 MG PO TABS: ORAL_TABLET | ORAL | 3 refills | Status: DC

## 2021-06-28 NOTE — Addendum Note (Signed)
Addended by: Margaret Pyle D on: 06/28/2021 08:53 AM ? ? Modules accepted: Orders ? ?

## 2021-12-10 ENCOUNTER — Telehealth: Payer: Self-pay | Admitting: Interventional Cardiology

## 2021-12-10 MED ORDER — ATORVASTATIN CALCIUM 80 MG PO TABS: ORAL_TABLET | ORAL | 1 refills | Status: DC

## 2021-12-10 NOTE — Telephone Encounter (Signed)
*  STAT* If patient is at the pharmacy, call can be transferred to refill team.   1. Which medications need to be refilled? (please list name of each medication and dose if known)  atorvastatin (LIPITOR) 80 MG tablet   2. Which pharmacy/location (including street and city if local pharmacy) is medication to be sent to? [id number] - [place],  - [address]   3. Do they need a 30 day or 90 day supply? 90  

## 2021-12-10 NOTE — Telephone Encounter (Signed)
Pt's medication was sent to pt's pharmacy as requested. Confirmation received.  °

## 2021-12-31 ENCOUNTER — Emergency Department (HOSPITAL_BASED_OUTPATIENT_CLINIC_OR_DEPARTMENT_OTHER): Payer: Commercial Managed Care - PPO

## 2021-12-31 ENCOUNTER — Emergency Department (HOSPITAL_BASED_OUTPATIENT_CLINIC_OR_DEPARTMENT_OTHER)
Admission: EM | Admit: 2021-12-31 | Discharge: 2021-12-31 | Disposition: A | Discharge status: Home / Self Care | Payer: Commercial Managed Care - PPO | Attending: Emergency Medicine | Admitting: Emergency Medicine

## 2021-12-31 ENCOUNTER — Other Ambulatory Visit: Payer: Self-pay

## 2021-12-31 ENCOUNTER — Encounter (HOSPITAL_BASED_OUTPATIENT_CLINIC_OR_DEPARTMENT_OTHER): Payer: Self-pay

## 2021-12-31 DIAGNOSIS — N201 Calculus of ureter: Secondary | ICD-10-CM | POA: Insufficient documentation

## 2021-12-31 DIAGNOSIS — K8020 Calculus of gallbladder without cholecystitis without obstruction: Secondary | ICD-10-CM | POA: Diagnosis not present

## 2021-12-31 DIAGNOSIS — Z7901 Long term (current) use of anticoagulants: Secondary | ICD-10-CM | POA: Diagnosis not present

## 2021-12-31 DIAGNOSIS — R109 Unspecified abdominal pain: Secondary | ICD-10-CM | POA: Diagnosis present

## 2021-12-31 LAB — COMPREHENSIVE METABOLIC PANEL
ALT: 25 U/L (ref 0–44)
AST: 18 U/L (ref 15–41)
Albumin: 4.7 g/dL (ref 3.5–5.0)
Alkaline Phosphatase: 78 U/L (ref 38–126)
Anion gap: 7 (ref 5–15)
BUN: 14 mg/dL (ref 6–20)
CO2: 27 mmol/L (ref 22–32)
Calcium: 10 mg/dL (ref 8.9–10.3)
Chloride: 106 mmol/L (ref 98–111)
Creatinine, Ser: 0.98 mg/dL (ref 0.61–1.24)
GFR, Estimated: >60 mL/min (ref >60)
Glucose, Bld: 113 mg/dL — ABNORMAL HIGH (ref 70–99)
Potassium: 4 mmol/L (ref 3.5–5.1)
Sodium: 140 mmol/L (ref 135–145)
Total Bilirubin: 0.6 mg/dL (ref 0.3–1.2)
Total Protein: 7.2 g/dL (ref 6.5–8.1)

## 2021-12-31 LAB — CBC WITH DIFFERENTIAL/PLATELET
Abs Immature Granulocytes: 0.03 10*3/uL (ref 0.00–0.07)
Basophils Absolute: 0 10*3/uL (ref 0.0–0.1)
Basophils Relative: 0 %
Eosinophils Absolute: 0.1 10*3/uL (ref 0.0–0.5)
Eosinophils Relative: 1 %
HCT: 45.4 % (ref 39.0–52.0)
Hemoglobin: 16 g/dL (ref 13.0–17.0)
Immature Granulocytes: 0 %
Lymphocytes Relative: 14 %
Lymphs Abs: 1.4 10*3/uL (ref 0.7–4.0)
MCH: 33.1 pg (ref 26.0–34.0)
MCHC: 35.2 g/dL (ref 30.0–36.0)
MCV: 94 fL (ref 80.0–100.0)
Monocytes Absolute: 0.5 10*3/uL (ref 0.1–1.0)
Monocytes Relative: 5 %
Neutro Abs: 7.8 10*3/uL — ABNORMAL HIGH (ref 1.7–7.7)
Neutrophils Relative %: 80 %
Platelets: 224 10*3/uL (ref 150–400)
RBC: 4.83 MIL/uL (ref 4.22–5.81)
RDW: 13.2 % (ref 11.5–15.5)
WBC: 9.9 10*3/uL (ref 4.0–10.5)
nRBC: 0 % (ref 0.0–0.2)

## 2021-12-31 LAB — URINALYSIS, ROUTINE W REFLEX MICROSCOPIC
Bilirubin Urine: NEGATIVE
Glucose, UA: NEGATIVE mg/dL
Ketones, ur: NEGATIVE mg/dL
Leukocytes,Ua: NEGATIVE
Nitrite: NEGATIVE
Protein, ur: NEGATIVE mg/dL
Specific Gravity, Urine: 1.01 (ref 1.005–1.030)
pH: 5 (ref 5.0–8.0)

## 2021-12-31 MED ORDER — ONDANSETRON HCL 4 MG PO TABS: 4.0000 mg | ORAL_TABLET | Freq: Every day | ORAL | 1 refills | Status: DC | PRN

## 2021-12-31 MED ORDER — KETOROLAC TROMETHAMINE 30 MG/ML IJ SOLN
30.0000 mg | Freq: Once | INTRAMUSCULAR | Status: AC
  Administered 2021-12-31: 30 mg via INTRAVENOUS
  Filled 2021-12-31: qty 1

## 2021-12-31 MED ORDER — TAMSULOSIN HCL 0.4 MG PO CAPS: 0.4000 mg | ORAL_CAPSULE | Freq: Every day | ORAL | 0 refills | Status: DC

## 2021-12-31 MED ORDER — OXYCODONE-ACETAMINOPHEN 5-325 MG PO TABS: 1.0000 | ORAL_TABLET | ORAL | 0 refills | Status: DC | PRN

## 2021-12-31 NOTE — ED Triage Notes (Signed)
Left flank pain started on Friday +nausea Was seen at [facility] and was sent here for 3+ blood in urine

## 2022-01-03 NOTE — ED Provider Notes (Signed)
[facility] EMERGENCY DEPT Provider Note   CSN: [id number] Arrival date & time: 12/31/21  1318     History  Chief Complaint  Patient presents with   Flank Pain    Gregory Villa is a 61 y.o. male.  The history is provided by the patient. No language interpreter was used.  Flank Pain This is a new problem. The current episode started 2 days ago. The problem occurs constantly. The problem has not changed since onset.Nothing aggravates the symptoms. Nothing relieves the symptoms. He has tried nothing for the symptoms. The treatment provided no relief.       Home Medications Prior to Admission medications   Medication Sig Start Date End Date Taking? Authorizing Provider  ondansetron (ZOFRAN) 4 MG tablet Take 1 tablet (4 mg total) by mouth daily as needed for nausea or vomiting. 12/31/21 12/31/22 Yes Elson Areas, PA-C  oxyCODONE-acetaminophen (PERCOCET) 5-325 MG tablet Take 1 tablet by mouth every 4 (four) hours as needed for severe pain. 12/31/21 12/31/22 Yes Elson Areas, PA-C  tamsulosin (FLOMAX) 0.4 MG CAPS capsule Take 1 capsule (0.4 mg total) by mouth daily. 12/31/21  Yes Cheron Schaumann K, PA-C  atorvastatin (LIPITOR) 80 MG tablet TAKE 1 TABLET BY MOUTH DAILY AT [facility] 12/10/21   Corky Crafts, MD  clopidogrel (PLAVIX) 75 MG tablet TAKE 1 TABLET BY MOUTH EVERY DAY--STOP BRIL[place]TA 06/28/21   Corky Crafts, MD  nitroGLYCERIN (NITROSTAT) 0.4 MG SL tablet Place 1 tablet (0.4 mg total) under the tongue every 5 (five) minutes as needed. 03/22/19   Arty Baumgartner, NP      Allergies    Patient has no known allergies.    Review of Systems   Review of Systems  Genitourinary:  Positive for flank pain.  All other systems reviewed and are negative.   Physical Exam Updated Vital Signs BP (!) 137/92   Pulse 66   Temp 98.5 F (36.9 C) (Oral)   Resp 18   Ht 5\' 7"  (1.702 m)   Wt 85.7 kg   SpO2 95%   BMI 29.60 kg/m  Physical Exam Vitals and  nursing note reviewed.  Constitutional:      Appearance: He is well-developed.  HENT:     Head: Normocephalic.  Cardiovascular:     Rate and Rhythm: Normal rate.  Pulmonary:     Effort: Pulmonary effort is normal.  Abdominal:     General: There is no distension.  Musculoskeletal:        General: Normal range of motion.     Cervical back: Normal range of motion.  Skin:    General: Skin is warm.  Neurological:     Mental Status: He is alert and oriented to person, place, and time.  Psychiatric:        Mood and Affect: Mood normal.     ED Results / Procedures / Treatments   Labs (all labs ordered are listed, but only abnormal results are displayed) Labs Reviewed  CBC WITH DIFFERENTIAL/PLATELET - Abnormal; Notable for the following components:      Result Value   Neutro Abs 7.8 (*)    All other components within normal limits  COMPREHENSIVE METABOLIC PANEL - Abnormal; Notable for the following components:   Glucose, Bld 113 (*)    All other components within normal limits  URINALYSIS, ROUTINE W REFLEX MICROSCOPIC - Abnormal; Notable for the following components:   Hgb urine dipstick LARGE (*)    All other components within normal limits  EKG None  Radiology No results found.  Procedures Procedures    Medications Ordered in ED Medications  ketorolac (TORADOL) 30 MG/ML injection 30 mg (30 mg Intravenous Given 12/31/21 1729)    ED Course/ Medical Decision Making/ A&P                           Medical Decision Making Pt complains of flank pain.    Amount and/or Complexity of Data Reviewed Independent Historian: spouse    Details: Pt here with spouse  Labs: ordered.    Details: Ua  large blood,  Labs ordered, reviewed and interpreted  Radiology: ordered and independent interpretation performed. Decision-making details documented in ED Course.    Details: 6 mm stone    Risk Prescription drug management.           Final Clinical Impression(s) /  ED Diagnoses Final diagnoses:  Left ureteral stone  Calculus of gallbladder without cholecystitis without obstruction    Rx / DC Orders ED Discharge Orders          Ordered    oxyCODONE-acetaminophen (PERCOCET) 5-325 MG tablet  Every 4 hours PRN        12/31/21 1722    tamsulosin (FLOMAX) 0.4 MG CAPS capsule  Daily        12/31/21 1722    ondansetron (ZOFRAN) 4 MG tablet  Daily PRN        12/31/21 1722           An After Visit Summary was printed and given to the patient.    Fransico Meadow, PA-C 27/74/12 [id number]    Lianne Cure, DO 76/72/09 [phone]

## 2022-07-24 ENCOUNTER — Other Ambulatory Visit: Payer: Self-pay

## 2022-07-24 MED ORDER — CLOPIDOGREL BISULFATE 75 MG PO TABS: ORAL_TABLET | ORAL | 0 refills | Status: DC

## 2022-07-24 MED ORDER — ATORVASTATIN CALCIUM 80 MG PO TABS: ORAL_TABLET | ORAL | 0 refills | Status: DC

## 2022-09-01 NOTE — Progress Notes (Unsigned)
Cardiology Office Note   Date:  09/02/2022   ID:  Gregory Villa, DOB 1960/03/30, MRN [id number]  PCP:  Gregory Rua, MD    No chief complaint on file.  CAD  Wt Readings from Last 3 Encounters:  09/02/22 193 lb (87.5 kg)  12/31/21 189 lb (85.7 kg)  05/31/21 189 lb (85.7 kg)       History of Present Illness: Gregory Villa is a 62 y.o. male  with a history of acute inferior ST elevation MI in January 2021.  Cardiac cath at that time showed: Mid RCA lesion is 100% stenosed. A drug-eluting stent was successfully placed using a STENT RESOLUTE ONYX 4.0X26. Post intervention, there is a 0% residual stenosis. RPAV lesion is 99% stenosed. Balloon angioplasty was performed using a BALLOON SAPPHIRE 2.5X12. Post intervention, there is a 30% residual stenosis. 1st Mrg lesion is 90% stenosed. Mid LAD lesion is 100% stenosed. This is a CTO with retrograde filling from the diagonal, antegrade into the mid to distal LAD. Ost LAD to Prox LAD lesion is 50% stenosed. Mid Cx lesion is 25% stenosed. 2nd Mrg lesion is 25% stenosed. Dist Cx lesion is 80% stenosed. This is a small vessel. The left ventricular systolic function is normal. LV end diastolic pressure is mildly elevated. The left ventricular ejection fraction is 55-65% by visual estimate. There is no aortic valve stenosis.   In February 2021, he had an elective CTO PCI of the LAD.  Catheter results showed: "Mid LAD lesion is 100% stenosed. This was a chronic total occlusion. Successful revascularization of the chronic total occlusion with A drug-eluting stent was successfully placed using a SYNERGY XD 3.0X32. Post intervention, there is a 0% residual stenosis. Ost LAD to Prox LAD lesion is 50% stenosed. 2nd Diag lesion is 75% stenosed. Balloon angioplasty was performed using a BALLOON EMERGE MR 2.0X12. Post intervention, there is a 25% residual stenosis. 1st Mrg lesion is 90% stenosed. A drug-eluting stent was successfully  placed using a SYNERGY XD 2.50X28. Post intervention, there is a 0% residual stenosis."   In the past, he had some nuisance bleeding when he cuts himself and bleeding hemorrhoids.    He noted that his [facility] improved after the CTO PCI of the LAD.     10/23 had a kidney stone which passed.  Took Flomax.  Denies : Chest pain. Dizziness. Leg edema. Nitroglycerin use. Orthopnea. Palpitations. Paroxysmal nocturnal dyspnea. Shortness of breath. Syncope.    Typical activity is working around the yard and does a youtube exercise routine- does this on the weekend.   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          Past Medical History:  Diagnosis Date   CAD (coronary artery disease), native coronary artery    a. cath 03/2019 s/p DES to [facility] and angioplast only to RPAV, medical therapy for CTO of LAD adn 80% small non dominant LCx   History of acute myocardial infarction of inferior wall 04/29/2019   Hyperlipidemia LDL goal <70    S/P angioplasty with stent mLAD 04/28/19 DES-CTO, POBA to to 2nd diag, 1st Mgr with DES  04/29/2019   Tobacco abuse     Past Surgical History:  Procedure Laterality Date   CORONARY BALLOON ANGIOPLASTY N/A 04/28/2019   Procedure: CORONARY BALLOON ANGIOPLASTY;  Surgeon: Corky Crafts, MD;  Location: [facility] INVASIVE CV LAB;  Service: Cardiovascular;  Laterality: N/A;   CORONARY CTO INTERVENTION N/A 04/28/2019   Procedure: CORONARY CTO INTERVENTION;  Surgeon:  Corky Crafts, MD;  Location: [facility] INVASIVE CV LAB;  Service: Cardiovascular;  Laterality: N/A;   CORONARY/GRAFT ACUTE MI REVASCULARIZATION N/A 03/21/2019   Procedure: CORONARY/GRAFT ACUTE MI REVASCULARIZATION;  Surgeon: Corky Crafts, MD;  Location: [facility] INVASIVE CV LAB;  Service: Cardiovascular;  Laterality: N/A;   LEFT HEART CATH AND CORONARY ANGIOGRAPHY N/A 03/21/2019   Procedure: LEFT HEART CATH AND CORONARY ANGIOGRAPHY;  Surgeon: Corky Crafts, MD;  Location: [facility] INVASIVE CV LAB;  Service:  Cardiovascular;  Laterality: N/A;   LEFT HEART CATH AND CORONARY ANGIOGRAPHY N/A 04/28/2019   Procedure: LEFT HEART CATH AND CORONARY ANGIOGRAPHY;  Surgeon: Corky Crafts, MD;  Location: [facility] INVASIVE CV LAB;  Service: Cardiovascular;  Laterality: N/A;     Current Outpatient Medications  Medication Sig Dispense Refill   atorvastatin (LIPITOR) 80 MG tablet TAKE 1 TABLET BY MOUTH DAILY AT 6PM 90 tablet 0   clopidogrel (PLAVIX) 75 MG tablet TAKE 1 TABLET BY MOUTH EVERY DAY--STOP BRILINTA 90 tablet 0   nitroGLYCERIN (NITROSTAT) 0.4 MG SL tablet Place 1 tablet (0.4 mg total) under the tongue every 5 (five) minutes as needed. 25 tablet 2   No current facility-administered medications for this visit.    Allergies:   Patient has no known allergies.    Social History:  The patient  reports that he has quit smoking. His smoking use included cigarettes and cigars. He has never been exposed to tobacco smoke. He has never used smokeless tobacco. He reports current alcohol use. He reports that he does not use drugs.   Family History:  The patient's family history includes CAD in his brother.    ROS:  Please see the history of present illness.   Otherwise, review of systems are positive for prior kidney stone.   All other systems are reviewed and negative.    PHYSICAL EXAM: VS:  BP 116/72   Pulse (!) 59   Ht 5\' 6"  (1.676 m)   Wt 193 lb (87.5 kg)   BMI 31.15 kg/m  , BMI Body mass index is 31.15 kg/m. GEN: Well nourished, well developed, in no acute distress HEENT: normal Neck: no JVD, carotid bruits, or masses Cardiac: RRR; no murmurs, rubs, or gallops,no edema  Respiratory:  clear to auscultation bilaterally, normal work of breathing GI: soft, nontender, nondistended, + BS MS: no deformity or atrophy Skin: warm and dry, no rash Neuro:  Strength and sensation are intact Psych: euthymic mood, full affect   EKG:   The ekg ordered today demonstrates NSR, poor R wave progression, no  change from 2023   Recent Labs: 12/31/2021: ALT 25; BUN 14; Creatinine, Ser 0.98; Hemoglobin 16.0; Platelets 224; Potassium 4.0; Sodium 140   Lipid Panel    Component Value Date/Time   CHOL 95 (L) 06/01/2020 0834   TRIG 89 06/01/2020 0834   HDL 31 (L) 06/01/2020 0834   CHOLHDL 3.1 06/01/2020 0834   CHOLHDL 5.6 03/21/2019 0406   VLDL 21 03/21/2019 0406   LDLCALC 46 06/01/2020 0834     Other studies Reviewed: Additional studies/ records that were reviewed today with results demonstrating: labs reviewed, LDL 55 in 2022.   ASSESSMENT AND PLAN:  CAD/old MI: Continue clopidogrel monotherapy given multivessel stenting.  Continue aggressive secondary prevention.  No bleeding.  Refill clopidogrel. Hyperlipidemia: needs repeat lipids. Whole  Food plant-based diet. High fiber diet.  Avoid processed foods. Former smoker: Quit at the time of present. Elevated blood glucose: 113 in 10/23.  Check A1C.  Current medicines are reviewed at length with the patient today.  The patient concerns regarding his medicines were addressed.  The following changes have been made:  No change  Labs/ tests ordered today include:  No orders of the defined types were placed in this encounter.   Recommend 150 minutes/week of aerobic exercise Low fat, low carb, high fiber diet recommended  Disposition:   FU in 1 year   Signed, Lance Muss, MD  09/02/2022 9:21 AM    [facility] [address] [place], [place], [place]  [address] Phone: [phone]; Fax: [phone]

## 2022-09-02 ENCOUNTER — Encounter: Payer: Self-pay | Admitting: Interventional Cardiology

## 2022-09-02 ENCOUNTER — Ambulatory Visit: Payer: Commercial Managed Care - PPO | Attending: Interventional Cardiology | Admitting: Interventional Cardiology

## 2022-09-02 VITALS — BP 116/72 | HR 59 | Ht 66.0 in | Wt 193.0 lb

## 2022-09-02 DIAGNOSIS — E785 Hyperlipidemia, unspecified: Secondary | ICD-10-CM

## 2022-09-02 DIAGNOSIS — I2510 Atherosclerotic heart disease of native coronary artery without angina pectoris: Secondary | ICD-10-CM | POA: Diagnosis not present

## 2022-09-02 DIAGNOSIS — I252 Old myocardial infarction: Secondary | ICD-10-CM | POA: Diagnosis not present

## 2022-09-02 DIAGNOSIS — Z87891 Personal history of nicotine dependence: Secondary | ICD-10-CM | POA: Diagnosis not present

## 2022-09-02 DIAGNOSIS — R7301 Impaired fasting glucose: Secondary | ICD-10-CM

## 2022-09-02 MED ORDER — CLOPIDOGREL BISULFATE 75 MG PO TABS: ORAL_TABLET | ORAL | 3 refills | Status: DC

## 2022-09-02 NOTE — Patient Instructions (Signed)
Medication Instructions:  Your physician recommends that you continue on your current medications as directed. Please refer to the Current Medication list given to you today.  *If you need a refill on your cardiac medications before your next appointment, please call your pharmacy*   Lab Work: Lab work to be done today--CMET, Lipids, CBC, A1C If you have labs (blood work) drawn today and your tests are completely normal, you will receive your results only by: MyChart Message (if you have MyChart) OR A paper copy in the mail If you have any lab test that is abnormal or we need to change your treatment, we will call you to review the results.   Testing/Procedures: none   Follow-Up: At [facility], you and your health needs are our priority.  As part of our continuing mission to provide you with exceptional heart care, we have created designated Provider Care Teams.  These Care Teams include your primary Cardiologist (physician) and Advanced Practice Providers (APPs -  Physician Assistants and Nurse Practitioners) who all work together to provide you with the care you need, when you need it.  We recommend signing up for the patient portal called "MyChart".  Sign up information is provided on this After Visit Summary.  MyChart is used to connect with patients for Virtual Visits (Telemedicine).  Patients are able to view lab/test results, encounter notes, upcoming appointments, etc.  Non-urgent messages can be sent to your provider as well.   To learn more about what you can do with MyChart, go to [email or web address].    Your next appointment:   12 month(s)  Provider:   Lance Muss, MD     Other Instructions

## 2022-09-03 LAB — LIPID PANEL
Chol/HDL Ratio: 3.1 ratio (ref 0.0–5.0)
Cholesterol, Total: 100 mg/dL (ref 100–199)
HDL: 32 mg/dL — ABNORMAL LOW (ref >39)
LDL Chol Calc (NIH): 49 mg/dL (ref 0–99)
Triglycerides: 101 mg/dL (ref 0–149)
VLDL Cholesterol Cal: 19 mg/dL (ref 5–40)

## 2022-09-03 LAB — COMPREHENSIVE METABOLIC PANEL
ALT: 33 IU/L (ref 0–44)
AST: 20 IU/L (ref 0–40)
Albumin: 4.2 g/dL (ref 3.9–4.9)
Alkaline Phosphatase: 94 IU/L (ref 44–121)
BUN/Creatinine Ratio: 20 (ref 10–24)
BUN: 17 mg/dL (ref 8–27)
Bilirubin Total: 0.6 mg/dL (ref 0.0–1.2)
CO2: 22 mmol/L (ref 20–29)
Calcium: 9.4 mg/dL (ref 8.6–10.2)
Chloride: 106 mmol/L (ref 96–106)
Creatinine, Ser: 0.86 mg/dL (ref 0.76–1.27)
Globulin, Total: 2.2 g/dL (ref 1.5–4.5)
Glucose: 97 mg/dL (ref 70–99)
Potassium: 4.6 mmol/L (ref 3.5–5.2)
Sodium: 141 mmol/L (ref 134–144)
Total Protein: 6.4 g/dL (ref 6.0–8.5)
eGFR: 99 mL/min/{1.73_m2} (ref >59)

## 2022-09-03 LAB — CBC
Hematocrit: 46.6 % (ref 37.5–51.0)
Hemoglobin: 16.3 g/dL (ref 13.0–17.7)
MCH: 33.3 pg — ABNORMAL HIGH (ref 26.6–33.0)
MCHC: 35 g/dL (ref 31.5–35.7)
MCV: 95 fL (ref 79–97)
Platelets: 221 10*3/uL (ref 150–450)
RBC: 4.9 x10E6/uL (ref 4.14–5.80)
RDW: 12.9 % (ref 11.6–15.4)
WBC: 6.9 10*3/uL (ref 3.4–10.8)

## 2022-09-03 LAB — HEMOGLOBIN A1C
Est. average glucose Bld gHb Est-mCnc: 126 mg/dL
Hgb A1c MFr Bld: 6 % — ABNORMAL HIGH (ref 4.8–5.6)

## 2023-01-10 ENCOUNTER — Ambulatory Visit (HOSPITAL_BASED_OUTPATIENT_CLINIC_OR_DEPARTMENT_OTHER)
Admission: RE | Admit: 2023-01-10 | Discharge: 2023-01-10 | Disposition: A | Discharge status: Home / Self Care | Payer: Commercial Managed Care - PPO | Source: Ambulatory Visit | Attending: Family Medicine | Admitting: Family Medicine

## 2023-01-10 ENCOUNTER — Other Ambulatory Visit (HOSPITAL_BASED_OUTPATIENT_CLINIC_OR_DEPARTMENT_OTHER): Payer: Self-pay | Admitting: Family Medicine

## 2023-01-10 DIAGNOSIS — M25562 Pain in left knee: Secondary | ICD-10-CM | POA: Insufficient documentation

## 2023-08-27 ENCOUNTER — Encounter (INDEPENDENT_AMBULATORY_CARE_PROVIDER_SITE_OTHER): Payer: Self-pay

## 2023-11-07 ENCOUNTER — Ambulatory Visit (INDEPENDENT_AMBULATORY_CARE_PROVIDER_SITE_OTHER): Admitting: Audiology

## 2023-11-07 ENCOUNTER — Ambulatory Visit (INDEPENDENT_AMBULATORY_CARE_PROVIDER_SITE_OTHER): Admitting: Physician Assistant

## 2023-11-07 VITALS — BP 145/90 | HR 69

## 2023-11-07 DIAGNOSIS — H6993 Unspecified Eustachian tube disorder, bilateral: Secondary | ICD-10-CM

## 2023-11-07 DIAGNOSIS — R42 Dizziness and giddiness: Secondary | ICD-10-CM

## 2023-11-07 DIAGNOSIS — H9041 Sensorineural hearing loss, unilateral, right ear, with unrestricted hearing on the contralateral side: Secondary | ICD-10-CM | POA: Diagnosis not present

## 2023-11-07 DIAGNOSIS — H903 Sensorineural hearing loss, bilateral: Secondary | ICD-10-CM | POA: Diagnosis not present

## 2023-11-07 MED ORDER — ONDANSETRON 4 MG PO TBDP: 4.0000 mg | ORAL_TABLET | Freq: Three times a day (TID) | ORAL | 0 refills | Status: AC | PRN

## 2023-11-07 MED ORDER — MECLIZINE HCL 25 MG PO TABS: 25.0000 mg | ORAL_TABLET | Freq: Three times a day (TID) | ORAL | 0 refills | Status: AC | PRN

## 2023-11-07 NOTE — Progress Notes (Signed)
  [address], [address] [place], [place] [address] [phone]  Audiological Evaluation    Name: Gregory Villa     DOB:   Apr 14, 1960      MRN:   [id number]                                                                                     Service Date: 11/07/2023     Accompanied by: unaccompanied   Patient comes today after Dr. Soldatova, ENT sent a referral for a hearing evaluation due to concerns with vertigo.   Symptoms Yes Details  Hearing loss  [x]  Right ear hearing loss  Tinnitus  [x]  Onset February 2025 and sounds like cicadas -right ear.  Ear pain/ infections/pressure  [x]  Right ear pressure.  Balance problems  [x]  Reported onset was February 2025. Since the has had it twice (second spell was May). No known triggers. Reports lasts about 30-40 minutes. Reports that right ear tinnitus, hearing loss and ear pressure worsen when he has the vertigo spell.  Noise exposure history  [x]  [employer] and occupational ( wears hearing protection at work)  Previous ear surgeries  []    Family history of hearing loss  [x]  Mother with age  Amplification  []    Other  []      Otoscopy: Right ear: Clear external ear canal and notable landmarks visualized on the tympanic membrane. Left ear:  Clear external ear canal and notable landmarks visualized on the tympanic membrane.  Tympanometry: Right ear: Type A- Normal external ear canal volume with normal middle ear pressure and tympanic membrane compliance. Left ear: Type A- Normal external ear canal volume with normal middle ear pressure and tympanic membrane compliance.   Pure tone Audiometry: Right ear- Borderline normal to severe sensorineural hearing loss from 125 Hz - 8000 Hz. Left ear-  Normal hearing from [phone], then mild presumably sensorineural hearing loss at 8000 Hz.  Speech Audiometry: Right ear- Speech Reception Threshold (SRT) was obtained at 20 dBHL. Left ear-Speech Reception Threshold (SRT) was obtained at 10  dBHL.   Word Recognition Score Tested using NU-6 (recorded) Right ear: 88% was obtained at a presentation level of 80 dBHL with contralateral masking which is deemed as  good . Left ear: 100% was obtained at a presentation level of 55 dBHL with contralateral masking which is deemed as  excellent.   The hearing test results were completed under headphones and results are deemed to be of good reliability. Test technique:  conventional    Impression: There is a significant difference in pure-tone thresholds between ears.   Recommendations: Follow up with ENT as scheduled for today. Return for a hearing evaluation at least in one year, before if concerns with hearing changes arise or per MD recommendation. Use hearing protection when exposed to loud/damaging sounds.  Consider a communication needs assessment after medical clearance for hearing aids is obtained.   Gregory Villa Gregory Villa, AUD

## 2023-11-07 NOTE — Progress Notes (Signed)
 error

## 2023-11-07 NOTE — Progress Notes (Signed)
 Dear Dr. Lennice, Here is my assessment for our mutual patient, Gregory Villa. Thank you for allowing me the opportunity to care for your patient. Please do not hesitate to contact me should you have any other questions. Sincerely, Chyrl Cohen PA-C  Otolaryngology Clinic Note Referring provider: Dr. Lennice HPI:  Gregory Villa is a 63 y.o. male kindly referred by Dr. Lennice   The patient is a 63 year old gentleman seen in our office for evaluation of dizziness and hearing loss.  The patient notes that 6 months ago he had an episode of ringing in his right ear, dizziness, nausea.  He describes the ringing is tonal, unilateral in the right ear, he notes an abrupt onset of room spinning dizziness associated nausea and right sided hearing loss.  He notes the symptoms lasted 30 to 40 minutes, the dizziness subsided at that time, he had some persistent nausea for several hours, but then eventually got back to his baseline.  He notes that once he returned to normal he had some persistent hearing loss in the right ear.  He reports that he had several other episodes over the last several months of similar presentation, he notes they came on out of the blue with no known trigger.  They lasted approximately the same amount of time, each time his hearing would get worse but would not return back to its baseline prior to the event.  He notes he did follow-up with his primary care provider he was given oral antibiotic.  He notes he did have some fullness and pressure in the right ear, this seemed to help some of the pressure but has not kept the symptoms from reoccurring.  To this he denies any hearing loss, no trauma to the ears, no history recurrent ear infections.  He notes he eats a relatively healthy diet, he would consider this low salt.  He drinks a couple coffee in the morning, he has a fairly stressful job.  He denies any significant pain throughout any of the episodes.  He denied any preceding illnesses or  ototoxic medications.  No associated neurologic symptoms with this.   Independent Review of Additional Tests or Records:  Audiological evaluation on 11/07/2023  Otoscopy: Right ear: Clear external ear canal and notable landmarks visualized on the tympanic membrane. Left ear:  Clear external ear canal and notable landmarks visualized on the tympanic membrane.   Tympanometry: Right ear: Type A- Normal external ear canal volume with normal middle ear pressure and tympanic membrane compliance. Left ear: Type A- Normal external ear canal volume with normal middle ear pressure and tympanic membrane compliance.    Pure tone Audiometry: Right ear- Borderline normal to severe sensorineural hearing loss from 125 Hz - 8000 Hz. Left ear-  Normal hearing from [phone] Hz, then mild presumably sensorineural hearing loss at 8000 Hz.   Speech Audiometry: Right ear- Speech Reception Threshold (SRT) was obtained at 20 dBHL. Left ear-Speech Reception Threshold (SRT) was obtained at 10 dBHL.   Word Recognition Score Tested using NU-6 (recorded) Right ear: 88% was obtained at a presentation level of 80 dBHL with contralateral masking which is deemed as  good . Left ear: 100% was obtained at a presentation level of 55 dBHL with contralateral masking which is deemed as  excellent.   The hearing test results were completed under headphones and results are deemed to be of good reliability. Test technique:  conventional     Impression: There is a significant difference in pure-tone thresholds between ears.  PMH/Meds/All/SocHx/FamHx/ROS:   Past Medical History:  Diagnosis Date   CAD (coronary artery disease), native coronary artery    a. cath 03/2019 s/p DES to [facility] and angioplast only to RPAV, medical therapy for CTO of LAD adn 80% small non dominant LCx   History of acute myocardial infarction of inferior wall 04/29/2019   Hyperlipidemia LDL goal <70    S/P angioplasty with stent mLAD 04/28/19 DES-CTO,  POBA to to 2nd diag, 1st Mgr with DES  04/29/2019   Tobacco abuse      Past Surgical History:  Procedure Laterality Date   CORONARY BALLOON ANGIOPLASTY N/A 04/28/2019   Procedure: CORONARY BALLOON ANGIOPLASTY;  Surgeon: Dann Candyce RAMAN, MD;  Location: [facility] INVASIVE CV LAB;  Service: Cardiovascular;  Laterality: N/A;   CORONARY CTO INTERVENTION N/A 04/28/2019   Procedure: CORONARY CTO INTERVENTION;  Surgeon: Dann Candyce RAMAN, MD;  Location: [facility] INVASIVE CV LAB;  Service: Cardiovascular;  Laterality: N/A;   CORONARY/GRAFT ACUTE MI REVASCULARIZATION N/A 03/21/2019   Procedure: CORONARY/GRAFT ACUTE MI REVASCULARIZATION;  Surgeon: Dann Candyce RAMAN, MD;  Location: [facility] INVASIVE CV LAB;  Service: Cardiovascular;  Laterality: N/A;   LEFT HEART CATH AND CORONARY ANGIOGRAPHY N/A 03/21/2019   Procedure: LEFT HEART CATH AND CORONARY ANGIOGRAPHY;  Surgeon: Dann Candyce RAMAN, MD;  Location: [facility] INVASIVE CV LAB;  Service: Cardiovascular;  Laterality: N/A;   LEFT HEART CATH AND CORONARY ANGIOGRAPHY N/A 04/28/2019   Procedure: LEFT HEART CATH AND CORONARY ANGIOGRAPHY;  Surgeon: Dann Candyce RAMAN, MD;  Location: [facility] INVASIVE CV LAB;  Service: Cardiovascular;  Laterality: N/A;    Family History  Problem Relation Age of Onset   CAD Brother      Social Connections: Not on file      Current Outpatient Medications:    clopidogrel  (PLAVIX ) 75 MG tablet, TAKE 1 TABLET BY MOUTH EVERY DAY, Disp: 90 tablet, Rfl: 3   atorvastatin  (LIPITOR ) 80 MG tablet, TAKE 1 TABLET BY MOUTH DAILY AT 6PM (Patient not taking: Reported on 11/07/2023), Disp: 90 tablet, Rfl: 0   nitroGLYCERIN  (NITROSTAT ) 0.4 MG SL tablet, Place 1 tablet (0.4 mg total) under the tongue every 5 (five) minutes as needed. (Patient not taking: Reported on 11/07/2023), Disp: 25 tablet, Rfl: 2   Physical Exam:   BP (!) 145/90   Pulse 69   SpO2 94%   Pertinent Findings  CN II-XII intact-no nystagmus Bilateral EAC clear and TM intact with well  pneumatized middle ear spaces Anterior rhinoscopy: Septum midline; bilateral inferior turbinates with no hypertrophy No lesions of oral cavity/oropharynx; dentition within normal limits No obviously palpable neck masses/lymphadenopathy/thyromegaly No respiratory distress or stridor  Seprately Identifiable Procedures:  None  Impression & Plans:  Atsushi Yom is a 63 y.o. 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  He has a fairly classic presentation with acute onset of dizziness, ringing, hearing loss which does not return back to its baseline with progression with each episode.  He has audiological evaluation is not classical for Mnire's but he does have unilateral right sided sensorineural hearing loss in the higher frequencies.  Symptoms are less likely labyrinthitis given the recurrence, timeframe and other associated symptoms.  Given his presentation I would like to obtain an MRI IAC for further evaluation to rule out any significant pathology.  Once the results are available I will speak with the patient about recommendation moving forward.  I have prescribed him meclizine  and Zofran  as needed  for symptomatic control.  Strict turn precautions given, he verbalized understanding and agreement to today's plan had no further questions or concerns.   - f/u phone call discussion after MRI IAC.   Thank you for allowing me the opportunity to care for your patient. Please do not hesitate to contact me should you have any other questions.  Sincerely, Chyrl Cohen PA-C [facility] Phone: [phone] Fax: [phone]  11/07/2023, 9:39 AM

## 2023-11-20 ENCOUNTER — Encounter: Payer: Self-pay | Admitting: Audiology

## 2024-03-23 ENCOUNTER — Encounter: Payer: Self-pay | Admitting: Student in an Organized Health Care Education/Training Program

## 2024-03-23 ENCOUNTER — Ambulatory Visit
Attending: Student in an Organized Health Care Education/Training Program | Admitting: Student in an Organized Health Care Education/Training Program

## 2024-03-23 VITALS — BP 110/80 | HR 70 | Ht 60.0 in | Wt 196.8 lb

## 2024-03-23 DIAGNOSIS — I2510 Atherosclerotic heart disease of native coronary artery without angina pectoris: Secondary | ICD-10-CM

## 2024-03-23 DIAGNOSIS — E785 Hyperlipidemia, unspecified: Secondary | ICD-10-CM

## 2024-03-23 DIAGNOSIS — Z87891 Personal history of nicotine dependence: Secondary | ICD-10-CM

## 2024-03-23 DIAGNOSIS — R7301 Impaired fasting glucose: Secondary | ICD-10-CM

## 2024-03-23 DIAGNOSIS — I252 Old myocardial infarction: Secondary | ICD-10-CM

## 2024-03-23 LAB — LIPID PANEL

## 2024-03-23 MED ORDER — ATORVASTATIN CALCIUM 80 MG PO TABS: ORAL_TABLET | ORAL | 3 refills | Status: AC

## 2024-03-23 MED ORDER — NITROGLYCERIN 0.4 MG SL SUBL: 0.4000 mg | SUBLINGUAL_TABLET | SUBLINGUAL | 2 refills | Status: AC | PRN

## 2024-03-23 MED ORDER — CLOPIDOGREL BISULFATE 75 MG PO TABS: ORAL_TABLET | ORAL | 3 refills | Status: AC

## 2024-03-23 NOTE — Patient Instructions (Signed)
 Medication Instructions:  RESTART Plavix  (daily), Atorvastatin  (daily), and Nitroglycerin  (as needed for chest pain)  *If you need a refill on your cardiac medications before your next appointment, please call your pharmacy*  Lab Work: Lipid panel Hgb A1c  CMP CBC  If you have labs (blood work) drawn today and your tests are completely normal, you will receive your results only by: MyChart Message (if you have MyChart) OR A paper copy in the mail If you have any lab test that is abnormal or we need to change your treatment, we will call you to review the results.    Follow-Up: At [facility], you and your health needs are our priority.  As part of our continuing mission to provide you with exceptional heart care, our providers are all part of one team.  This team includes your primary Cardiologist (physician) and Advanced Practice Providers or APPs (Physician Assistants and Nurse Practitioners) who all work together to provide you with the care you need, when you need it.  Your next appointment:   1 month(s)  Provider:   Georganna Archer, MD

## 2024-03-23 NOTE — Progress Notes (Signed)
 " Cardiology Office Note:   Date:  03/23/2024  ID:  Gregory Villa, DOB 28-Oct-1960, MRN [id number] PCP: Nanci Senior, MD  [facility] Providers Cardiologist:  Georganna Archer, MD { Chief Complaint:  Chief Complaint  Patient presents with   Chest Pain      History of Present Illness:   Gregory Villa is a 64 y.o. male with a PMH of STEMI s/p PCI (stent to RCA in 2021, stent to LAD CTO in 2021),  HLD, and prior tobacco use disorder who presents for follow up.  Last seen 1 year ago by Dr. Dann in clinic.  The patient states that he has been out of his medications for the past 2 months and thus has not taken any medication since then.  Over the same time course the patient reports that he has had intermittent burning chest pains that occur primarily when he lays down at night.  He takes Tums and feels like the chest pain improves.  He has not really been exercising so he is uncertain if it is exertional.  He says that it is very different than the chest pain he had when he had his MI; however, he did have frequent GERD leading up to his MI.  Denies all other symptoms including SOB, PND, orthopnea, swelling, palpitations, syncope and presyncope.  He continues to abstain from tobacco use.  No other concerns.   Past Medical History:  Diagnosis Date   CAD (coronary artery disease), native coronary artery    a. cath 03/2019 s/p DES to [facility] and angioplast only to RPAV, medical therapy for CTO of LAD adn 80% small non dominant LCx   History of acute myocardial infarction of inferior wall 04/29/2019   Hyperlipidemia LDL goal <70    S/P angioplasty with stent mLAD 04/28/19 DES-CTO, POBA to to 2nd diag, 1st Mgr with DES  04/29/2019   Tobacco abuse      Studies Reviewed:    EKG:  EKG Interpretation Date/Time:  Tuesday March 23 2024 13:25:38 EST Ventricular Rate:  70 PR Interval:  178 QRS Duration:  86 QT Interval:  398 QTC Calculation: 429 R Axis:   -48  Text  Interpretation: Sinus rhythm with occasional Premature ventricular complexes Left anterior fascicular block Inferior infarct When compared with ECG of 29-Apr-2019 04:24, Premature ventricular complexes are now Present Left anterior fascicular block is now Present T wave inversion no longer evident in Inferior leads Nonspecific T wave abnormality no longer evident in Lateral leads Confirmed by Archer Georganna [phone]) on 03/23/2024 1:31:18 PM     Cardiac Studies & Procedures   ______________________________________________________________________________________________ CARDIAC CATHETERIZATION  CARDIAC CATHETERIZATION 04/28/2019  Conclusion  Mid LAD lesion is 100% stenosed. This was a chronic total occlusion.  Successful revascularization of the chronic total occlusion with A drug-eluting stent was successfully placed using a SYNERGY XD 3.0X32.  Post intervention, there is a 0% residual stenosis.  Ost LAD to Prox LAD lesion is 50% stenosed.  2nd Diag lesion is 75% stenosed.  Balloon angioplasty was performed using a BALLOON EMERGE MR 2.0X12.  Post intervention, there is a 25% residual stenosis.  1st Mrg lesion is 90% stenosed.  A drug-eluting stent was successfully placed using a SYNERGY XD 2.50X28.  Post intervention, there is a 0% residual stenosis.  Mid Cx lesion is 25% stenosed.  2nd Mrg lesion is 25% stenosed.  Dist Cx lesion is 80% stenosed. This is a small vessel.  Prox RCA lesion is 25% stenosed.  RPAV lesion is 30%  stenosed.  Previously placed Mid RCA drug eluting stent is widely patent.  LV end diastolic pressure is normal.  There is no aortic valve stenosis.  Continue dual antiplatelet therapy along with aggressive secondary prevention.  He will be watched overnight given the prolonged bedrest he will need, and large bore catheter that was used in the groin.  He also does not live in [place].  Results were communicated to the wife.  Findings Coronary  Findings Diagnostic  Dominance: Right  Left Anterior Descending Ost LAD to Prox LAD lesion is 50% stenosed. Mid LAD lesion is 100% stenosed. The lesion is type C and chronically occluded.  Second Diagonal Branch Collaterals 2nd Diag filled by collaterals from 2nd Mrg.  2nd Diag lesion is 75% stenosed.  Left Circumflex Mid Cx lesion is 25% stenosed. Dist Cx lesion is 80% stenosed.  First Obtuse Marginal Branch 1st Mrg lesion is 90% stenosed.  Second Obtuse Marginal Branch Vessel is large in size. 2nd Mrg lesion is 25% stenosed.  Right Coronary Artery Vessel is large. Prox RCA lesion is 25% stenosed. Previously placed Mid RCA drug eluting stent is widely patent. Vessel is the culprit lesion. The lesion is type C, thrombotic and heavily thrombotic.  Right Posterior Atrioventricular Artery RPAV lesion is 30% stenosed. The lesion is heavily thrombotic. The lesion was previously treated.  Intervention  Mid LAD lesion Stent CATH MACH1 81F CLS3.5 guide catheter was inserted. Lesion crossed with guidewire using a GUIDEWIRE JUDO 3 190. Pre-stent angioplasty was performed using a BALLOON EMERGE MR 2.5X15. A drug-eluting stent was successfully placed using a SYNERGY XD 3.0X32. Stent strut is well apposed. Post-stent angioplasty was performed using a BALLOON [place] EMERGE MR 3.5X12. Simultaneous kissing balloons were done in the LAD and diagonal. Post-Intervention Lesion Assessment The intervention was successful. Pre-interventional TIMI flow is 0. Post-intervention TIMI flow is 3. No complications occurred at this lesion. There is a 0% residual stenosis post intervention.  2nd Diag lesion Angioplasty CATH MACH1 81F CLS3.5 guide catheter was inserted. WIRE COUGAR XT STRL 190CM guidewire used to cross lesion. Balloon angioplasty was performed using a BALLOON EMERGE MR 2.0X12. The one inflation was done with simultaneous kissing balloon in the LAD. Post-Intervention Lesion Assessment The  intervention was successful. Pre-interventional TIMI flow is 0. Post-intervention TIMI flow is 3. No complications occurred at this lesion. There is a 25% residual stenosis post intervention.  1st Mrg lesion Stent CATH MACH1 81F CLS3.5 guide catheter was inserted. Lesion crossed with guidewire using a WIRE COUGAR XT STRL 190CM. Pre-stent angioplasty was performed using a BALLOON EMERGE MR 2.5X15. A drug-eluting stent was successfully placed using a SYNERGY XD 2.50X28. Stent strut is well apposed. Post-stent angioplasty was not performed. Post-Intervention Lesion Assessment The intervention was successful. Pre-interventional TIMI flow is 3. Post-intervention TIMI flow is 3. No complications occurred at this lesion. There is a 0% residual stenosis post intervention.   CARDIAC CATHETERIZATION  CARDIAC CATHETERIZATION 03/21/2019  Conclusion  Mid RCA lesion is 100% stenosed.  A drug-eluting stent was successfully placed using a STENT RESOLUTE ONYX 4.0X26.  Post intervention, there is a 0% residual stenosis.  RPAV lesion is 99% stenosed.  Balloon angioplasty was performed using a BALLOON SAPPHIRE 2.5X12.  Post intervention, there is a 30% residual stenosis.  1st Mrg lesion is 90% stenosed.  Mid LAD lesion is 100% stenosed. This is a CTO with retrograde filling from the diagonal, antegrade into the mid to distal LAD.  Ost LAD to Prox LAD lesion is 50% stenosed.  Mid Cx lesion is 25% stenosed.  2nd Mrg lesion is 25% stenosed.  Dist Cx lesion is 80% stenosed. This is a small vessel.  The left ventricular systolic function is normal.  LV end diastolic pressure is mildly elevated.  The left ventricular ejection fraction is 55-65% by visual estimate.  There is no aortic valve stenosis.  Continue DAPT and initiate aggressive secondary prevention.  Watch in ICU.  Consider CTO PCI of the LAD in 4 weeks once he has recovered from this MI.  Continue IV tirofiban  for a few hours given  heavy thrombus burden and residual thrombus in small, posterolateral artery branches.  Findings Coronary Findings Diagnostic  Dominance: Right  Left Anterior Descending Ost LAD to Prox LAD lesion is 50% stenosed. Mid LAD lesion is 100% stenosed. The lesion is type C and chronically occluded.  Second Diagonal Branch Collaterals 2nd Diag filled by collaterals from 2nd Mrg.  Left Circumflex Mid Cx lesion is 25% stenosed. Dist Cx lesion is 80% stenosed.  First Obtuse Marginal Branch 1st Mrg lesion is 90% stenosed.  Second Obtuse Marginal Branch Vessel is large in size. 2nd Mrg lesion is 25% stenosed.  Right Coronary Artery Vessel is large. Prox RCA lesion is 25% stenosed. Mid RCA lesion is 100% stenosed. Vessel is the culprit lesion. The lesion is type C, thrombotic and heavily thrombotic.  Right Posterior Atrioventricular Artery RPAV lesion is 99% stenosed. The lesion is heavily thrombotic.  Intervention  Mid RCA lesion Thrombectomy CATHETER LAUNCHER 6FR AL1 guide catheter was inserted. WIRE ASAHI PROWATER 180CM guidewire was used to cross lesion. Aspiration thrombectomy performed using a CATH EXTRAC PRONTO 5.72F 138CM. Stent CATHETER LAUNCHER 6FR AL1 guide catheter was inserted. Lesion crossed with guidewire using a WIRE HI TORQ BMW 190CM. Pre-stent angioplasty was performed using a BALLOON SAPPHIRE 2.5X12. A drug-eluting stent was successfully placed using a STENT RESOLUTE ONYX 4.0X26. Stent strut is well apposed. Post-stent angioplasty was not performed. Prowater was placed first.  BMW placed into PLA branch.  PTCA of PLA done.  Unable to advance 38 mm stent with buddy wires.  Telescope advanced to mid RCA but stent would not pass through telescope, even with Prowater removed. Telescope removed.    4.0 x 26 easily passed to lesion over the BMW wire. Post-Intervention Lesion Assessment The intervention was successful. Pre-interventional TIMI flow is 0. Post-intervention TIMI  flow is 3. No complications occurred at this lesion. There is a 0% residual stenosis post intervention.  RPAV lesion Angioplasty CATHETER LAUNCHER 6FR AL1 guide catheter was inserted. WIRE HI TORQ BMW 190CM guidewire used to cross lesion. Balloon angioplasty was performed using a BALLOON SAPPHIRE 2.5X12. Post-Intervention Lesion Assessment The intervention was successful. Pre-interventional TIMI flow is 1. Post-intervention TIMI flow is 3. No complications occurred at this lesion. There is a 30% residual stenosis post intervention.   STRESS TESTS  MYOCARDIAL PERFUSION IMAGING 12/18/2020  Interpretation Summary   Findings are consistent with no prior ischemia. The study is intermediate risk.   No ST deviation was noted.   Left ventricular function is abnormal. Global function is moderately reduced. End diastolic cavity size is normal. End systolic cavity size is normal.   Prior study not available for comparison.  Findings: Negative for stress induced arrhythmias. No evidence of ischemia or infarction. Moderately decrease LVEF-40% with global hypokinesis.  Conclusions: Stress test is negative for ischemia. This is consistent with a moderate risk study due to LV function. ECG pattern and perfusion patter not consistent with apical hypertrophic cardiomyopathy,  but given decrease in function, consider echocardiogram or cardiac MRI for further assessment.   ECHOCARDIOGRAM  ECHOCARDIOGRAM COMPLETE 01/08/2021  Narrative ECHOCARDIOGRAM REPORT    Patient Name:   Gregory Villa Date of Exam: 01/08/2021 Medical Rec #:  [id number]       Height:       66.0 in Accession #:    [id number]      Weight:       193.0 lb Date of Birth:  1960/07/22       BSA:          1.970 m Patient Age:    60 years        BP:           134/82 mmHg Patient Gender: M               HR:           63 bpm. Exam Location:  [address]  Procedure: 2D Echo, Cardiac Doppler and Color Doppler  Indications:     I25.10 Coronary artery disease Native Vessel  History:        Patient has prior history of Echocardiogram examinations, most recent 03/21/2019. Previous Myocardial Infarction; Risk Factors:Dyslipidemia and Former Smoker. Decreased EF on stress test.  Sonographer:    Jon Hacker RCS Referring Phys: DAVE JAYADEEP S VARANASI  IMPRESSIONS   1. Left ventricular ejection fraction, by estimation, is 60 to 65%. The left ventricle has normal function. The left ventricle has no regional wall motion abnormalities. Left ventricular diastolic parameters were normal. 2. Right ventricular systolic function is normal. The right ventricular size is normal. Tricuspid regurgitation signal is inadequate for assessing PA pressure. 3. The mitral valve is grossly normal. Trivial mitral valve regurgitation. No evidence of mitral stenosis. 4. The aortic valve is tricuspid. Aortic valve regurgitation is not visualized. No aortic stenosis is present. 5. The inferior vena cava is normal in size with greater than 50% respiratory variability, suggesting right atrial pressure of 3 mmHg.  Comparison(s): No significant change from prior study.  FINDINGS Left Ventricle: Left ventricular ejection fraction, by estimation, is 60 to 65%. The left ventricle has normal function. The left ventricle has no regional wall motion abnormalities. The left ventricular internal cavity size was normal in size. There is no left ventricular hypertrophy. Left ventricular diastolic parameters were normal.  Right Ventricle: The right ventricular size is normal. No increase in right ventricular wall thickness. Right ventricular systolic function is normal. Tricuspid regurgitation signal is inadequate for assessing PA pressure.  Left Atrium: Left atrial size was normal in size.  Right Atrium: Right atrial size was normal in size.  Pericardium: There is no evidence of pericardial effusion.  Mitral Valve: The mitral valve is grossly normal.  Trivial mitral valve regurgitation. No evidence of mitral valve stenosis.  Tricuspid Valve: The tricuspid valve is grossly normal. Tricuspid valve regurgitation is trivial. No evidence of tricuspid stenosis.  Aortic Valve: The aortic valve is tricuspid. Aortic valve regurgitation is not visualized. No aortic stenosis is present.  Pulmonic Valve: The pulmonic valve was grossly normal. Pulmonic valve regurgitation is not visualized. No evidence of pulmonic stenosis.  Aorta: The aortic root and ascending aorta are structurally normal, with no evidence of dilitation.  Venous: The right lower pulmonary vein is normal. The inferior vena cava is normal in size with greater than 50% respiratory variability, suggesting right atrial pressure of 3 mmHg.  IAS/Shunts: The atrial septum is grossly normal.   LEFT VENTRICLE PLAX 2D LVIDd:  5.00 cm   Diastology LVIDs:         3.30 cm   LV e' medial:    8.70 cm/s LV PW:         1.10 cm   LV E/e' medial:  8.0 LV IVS:        1.10 cm   LV e' lateral:   10.90 cm/s LVOT diam:     2.10 cm   LV E/e' lateral: 6.3 LV SV:         80 LV SV Index:   40 LVOT Area:     3.46 cm   RIGHT VENTRICLE RV Basal diam:  1.80 cm RV S prime:     10.60 cm/s TAPSE (M-mode): 1.7 cm  LEFT ATRIUM             Index        RIGHT ATRIUM          Index LA diam:        3.50 cm 1.78 cm/m   RA Area:     9.91 cm LA Vol (A2C):   52.4 ml 26.60 ml/m  RA Volume:   14.00 ml 7.11 ml/m LA Vol (A4C):   57.9 ml 29.39 ml/m LA Biplane Vol: 58.4 ml 29.65 ml/m AORTIC VALVE LVOT Vmax:   106.00 cm/s LVOT Vmean:  69.700 cm/s LVOT VTI:    0.230 m  AORTA Ao Root diam: 3.50 cm  MITRAL VALVE MV Area (PHT): 3.53 cm    SHUNTS MV Decel Time: 215 msec    Systemic VTI:  0.23 m MV E velocity: 69.20 cm/s  Systemic Diam: 2.10 cm MV A velocity: 88.60 cm/s MV E/A ratio:  0.78  Darryle Decent MD Electronically signed by Darryle Decent MD Signature Date/Time: 01/08/2021/5:38:27  PM    Final          ______________________________________________________________________________________________      Risk Assessment/Calculations:              Physical Exam:     VS:  BP 110/80   Pulse 70   Ht 5' (1.524 m)   Wt 196 lb 12.8 oz (89.3 kg)   SpO2 96%   BMI 38.43 kg/m      Wt Readings from Last 3 Encounters:  09/02/22 193 lb (87.5 kg)  12/31/21 189 lb (85.7 kg)  05/31/21 189 lb (85.7 kg)     GEN: Well nourished, well developed, in no acute distress NECK: No JVD; No carotid bruits CARDIAC: RRR, no murmurs, rubs, gallops RESPIRATORY:  Clear to auscultation without rales, wheezing or rhonchi  ABDOMEN: Soft, non-tender, non-distended, normal bowel sounds EXTREMITIES:  Warm and well perfused, no edema; No deformity, 2+ radial pulses PSYCH: Normal mood and affect   Assessment & Plan  #Inferior STEMI s/p PCI to RCA and PCI to LAD CTO (2021) #Chest Pain - Patient presents for annual follow-up for his CAD. - Reports having intermittent chest pains over the past couple months since running out of his medications. - Truly his symptoms sound like GERD; however, he gives me pause that his symptoms leading up to his prior MI also felt like GERD. - I will get him back on all of his appropriate cardiovascular medications and instructed him to use OTC Tums as needed for GERD symptoms. - I will see the patient back in 1 month and if his symptoms persist despite being back on his heart medicines and taking Tums then we will need to consider stress test versus repeat LHC.  Refill Plavix  75 mg daily Refill atorvastatin  80 mg daily Refill as needed sublingual nitroglycerin  Check lipid panel, A1c, CMP, CBC Follow-up in 1 month  #HLD Refill statin and check lipids as above         This note was written with the assistance of a dictation microphone or AI dictation software. Please excuse any typos or grammatical errors.   Signed, Georganna Archer,  MD 03/23/2024 1:13 PM    [facility]  "

## 2024-03-24 LAB — LIPID PANEL
Cholesterol, Total: 218 mg/dL — AB (ref 100–199)
HDL: 27 mg/dL — AB
LDL CALC COMMENT:: 8.1 ratio — AB (ref 0.0–5.0)
LDL Chol Calc (NIH): 117 mg/dL — AB (ref 0–99)
Triglycerides: 424 mg/dL — AB (ref 0–149)
VLDL Cholesterol Cal: 74 mg/dL — AB (ref 5–40)

## 2024-03-24 LAB — COMPREHENSIVE METABOLIC PANEL WITH GFR
ALT: 59 IU/L — AB (ref 0–44)
AST: 32 IU/L (ref 0–40)
Albumin: 4.4 g/dL (ref 3.9–4.9)
Alkaline Phosphatase: 113 IU/L (ref 47–123)
BUN/Creatinine Ratio: 21 (ref 10–24)
BUN: 18 mg/dL (ref 8–27)
Bilirubin Total: 0.4 mg/dL (ref 0.0–1.2)
CO2: 20 mmol/L (ref 20–29)
Calcium: 9.1 mg/dL (ref 8.6–10.2)
Chloride: 104 mmol/L (ref 96–106)
Creatinine, Ser: 0.87 mg/dL (ref 0.76–1.27)
Globulin, Total: 2.3 g/dL (ref 1.5–4.5)
Glucose: 100 mg/dL — AB (ref 70–99)
Potassium: 4.4 mmol/L (ref 3.5–5.2)
Sodium: 139 mmol/L (ref 134–144)
Total Protein: 6.7 g/dL (ref 6.0–8.5)
eGFR: 98 mL/min/1.73

## 2024-03-24 LAB — CBC
Hematocrit: 46.8 % (ref 37.5–51.0)
Hemoglobin: 16.1 g/dL (ref 13.0–17.7)
MCH: 32.6 pg (ref 26.6–33.0)
MCHC: 34.4 g/dL (ref 31.5–35.7)
MCV: 95 fL (ref 79–97)
Platelets: 246 x10E3/uL (ref 150–450)
RBC: 4.94 x10E6/uL (ref 4.14–5.80)
RDW: 12.8 % (ref 11.6–15.4)
WBC: 6.5 x10E3/uL (ref 3.4–10.8)

## 2024-03-24 LAB — HEMOGLOBIN A1C
Est. average glucose Bld gHb Est-mCnc: 117 mg/dL
Hgb A1c MFr Bld: 5.7 % — ABNORMAL HIGH (ref 4.8–5.6)

## 2024-03-26 ENCOUNTER — Ambulatory Visit: Payer: Self-pay | Admitting: Student in an Organized Health Care Education/Training Program

## 2024-05-11 ENCOUNTER — Ambulatory Visit: Admitting: Student in an Organized Health Care Education/Training Program
